# Patient Record
Sex: Male | Born: 1965 | Race: White
Health system: Southern US, Community
[De-identification: ages and names within clinical notes are randomized; demographics above are authoritative.]

## PROBLEM LIST (undated history)

## (undated) DIAGNOSIS — E119 Type 2 diabetes mellitus without complications: Secondary | ICD-10-CM

## (undated) DIAGNOSIS — E785 Hyperlipidemia, unspecified: Secondary | ICD-10-CM

## (undated) DIAGNOSIS — R011 Cardiac murmur, unspecified: Secondary | ICD-10-CM

## (undated) DIAGNOSIS — I10 Essential (primary) hypertension: Secondary | ICD-10-CM

## (undated) DIAGNOSIS — K743 Primary biliary cirrhosis: Secondary | ICD-10-CM

## (undated) DIAGNOSIS — E7800 Pure hypercholesterolemia, unspecified: Secondary | ICD-10-CM

## (undated) DIAGNOSIS — T7840XA Allergy, unspecified, initial encounter: Secondary | ICD-10-CM

## (undated) DIAGNOSIS — M1990 Unspecified osteoarthritis, unspecified site: Secondary | ICD-10-CM

## (undated) DIAGNOSIS — Z8679 Personal history of other diseases of the circulatory system: Secondary | ICD-10-CM

## (undated) DIAGNOSIS — N453 Epididymo-orchitis: Secondary | ICD-10-CM

## (undated) DIAGNOSIS — M546 Pain in thoracic spine: Secondary | ICD-10-CM

## (undated) DIAGNOSIS — J309 Allergic rhinitis, unspecified: Secondary | ICD-10-CM

## (undated) DIAGNOSIS — S139XXA Sprain of joints and ligaments of unspecified parts of neck, initial encounter: Secondary | ICD-10-CM

## (undated) DIAGNOSIS — L989 Disorder of the skin and subcutaneous tissue, unspecified: Secondary | ICD-10-CM

## (undated) DIAGNOSIS — K219 Gastro-esophageal reflux disease without esophagitis: Secondary | ICD-10-CM

## (undated) DIAGNOSIS — F528 Other sexual dysfunction not due to a substance or known physiological condition: Secondary | ICD-10-CM

## (undated) DIAGNOSIS — M109 Gout, unspecified: Secondary | ICD-10-CM

## (undated) HISTORY — DX: Primary biliary cirrhosis: K74.3

## (undated) HISTORY — DX: Type 2 diabetes mellitus without complications: E11.9

## (undated) HISTORY — DX: Pain in thoracic spine: M54.6

## (undated) HISTORY — DX: Personal history of other diseases of the circulatory system: Z86.79

## (undated) HISTORY — DX: Gastro-esophageal reflux disease without esophagitis: K21.9

## (undated) HISTORY — PX: OTHER SURGICAL HISTORY: SHX169

## (undated) HISTORY — DX: Gout, unspecified: M10.9

## (undated) HISTORY — DX: Unspecified osteoarthritis, unspecified site: M19.90

## (undated) HISTORY — DX: Essential (primary) hypertension: I10

## (undated) HISTORY — DX: Pure hypercholesterolemia, unspecified: E78.00

## (undated) HISTORY — DX: Allergic rhinitis, unspecified: J30.9

## (undated) HISTORY — PX: COLONOSCOPY: SHX174

## (undated) HISTORY — DX: Sprain of joints and ligaments of unspecified parts of neck, initial encounter: S13.9XXA

## (undated) HISTORY — DX: Disorder of the skin and subcutaneous tissue, unspecified: L98.9

## (undated) HISTORY — DX: Other sexual dysfunction not due to a substance or known physiological condition: F52.8

## (undated) HISTORY — DX: Hyperlipidemia, unspecified: E78.5

## (undated) HISTORY — DX: Epididymo-orchitis: N45.3

## (undated) HISTORY — DX: Allergy, unspecified, initial encounter: T78.40XA

## (undated) HISTORY — DX: Cardiac murmur, unspecified: R01.1

---

## 2003-12-10 HISTORY — PX: PERCUTANEOUS LIVER BIOPSY: SUR136

## 2004-02-17 ENCOUNTER — Ambulatory Visit (HOSPITAL_COMMUNITY): Admission: RE | Admit: 2004-02-17 | Discharge: 2004-02-17 | Payer: Self-pay | Admitting: Gastroenterology

## 2004-02-17 ENCOUNTER — Encounter (INDEPENDENT_AMBULATORY_CARE_PROVIDER_SITE_OTHER): Payer: Self-pay | Admitting: Specialist

## 2004-02-17 DIAGNOSIS — K743 Primary biliary cirrhosis: Secondary | ICD-10-CM

## 2004-02-17 HISTORY — DX: Primary biliary cirrhosis: K74.3

## 2004-02-20 ENCOUNTER — Ambulatory Visit (HOSPITAL_COMMUNITY): Admission: RE | Admit: 2004-02-20 | Discharge: 2004-02-20 | Payer: Self-pay | Admitting: Diagnostic Radiology

## 2005-01-04 ENCOUNTER — Ambulatory Visit: Payer: Self-pay | Admitting: Internal Medicine

## 2005-01-08 ENCOUNTER — Ambulatory Visit: Payer: Self-pay | Admitting: Internal Medicine

## 2005-02-06 ENCOUNTER — Ambulatory Visit: Payer: Self-pay | Admitting: Internal Medicine

## 2005-04-26 ENCOUNTER — Ambulatory Visit: Payer: Self-pay | Admitting: Internal Medicine

## 2006-01-14 ENCOUNTER — Ambulatory Visit: Payer: Self-pay | Admitting: Internal Medicine

## 2006-01-17 ENCOUNTER — Ambulatory Visit: Payer: Self-pay | Admitting: Internal Medicine

## 2006-03-25 ENCOUNTER — Ambulatory Visit: Payer: Self-pay | Admitting: Gastroenterology

## 2007-01-12 ENCOUNTER — Ambulatory Visit: Payer: Self-pay | Admitting: Internal Medicine

## 2007-01-12 ENCOUNTER — Ambulatory Visit: Payer: Self-pay | Admitting: Cardiology

## 2007-01-12 ENCOUNTER — Inpatient Hospital Stay (HOSPITAL_COMMUNITY): Admission: EM | Admit: 2007-01-12 | Discharge: 2007-01-13 | Payer: Self-pay | Admitting: Emergency Medicine

## 2007-01-12 LAB — CONVERTED CEMR LAB
ALT: 70 units/L — ABNORMAL HIGH (ref 0–40)
AST: 40 units/L — ABNORMAL HIGH (ref 0–37)
BUN: 11 mg/dL (ref 6–23)
Basophils Absolute: 0 10*3/uL (ref 0.0–0.1)
Basophils Relative: 0.2 % (ref 0.0–1.0)
Bilirubin Urine: NEGATIVE
CO2: 33 meq/L — ABNORMAL HIGH (ref 19–32)
Calcium: 9.9 mg/dL (ref 8.4–10.5)
Chloride: 100 meq/L (ref 96–112)
Creatinine, Ser: 1.1 mg/dL (ref 0.4–1.5)
GFR calc Af Amer: 95 mL/min
Glucose, Bld: 134 mg/dL — ABNORMAL HIGH (ref 70–99)
HCT: 43.1 % (ref 39.0–52.0)
HDL: 39.5 mg/dL (ref >39.0)
Hemoglobin, Urine: NEGATIVE
Hemoglobin: 15 g/dL (ref 13.0–17.0)
Hgb A1c MFr Bld: 5.8 % (ref 4.6–6.0)
Ketones, ur: NEGATIVE mg/dL
LDL Cholesterol: 68 mg/dL (ref 0–99)
MCV: 87.5 fL (ref 78.0–100.0)
Monocytes Relative: 12.9 % — ABNORMAL HIGH (ref 3.0–11.0)
Neutrophils Relative %: 72.8 % (ref 43.0–77.0)
Platelets: 207 10*3/uL (ref 150–400)
TSH: 0.4 microintl units/mL (ref 0.35–5.50)
Total Bilirubin: 0.8 mg/dL (ref 0.3–1.2)
Triglycerides: 77 mg/dL (ref 0–149)
VLDL: 15 mg/dL (ref 0–40)
WBC: 6.4 10*3/uL (ref 4.5–10.5)
pH: 6 (ref 5.0–8.0)

## 2007-02-10 ENCOUNTER — Ambulatory Visit: Payer: Self-pay | Admitting: Internal Medicine

## 2007-02-19 ENCOUNTER — Ambulatory Visit: Payer: Self-pay | Admitting: Cardiology

## 2007-08-06 ENCOUNTER — Encounter: Payer: Self-pay | Admitting: Internal Medicine

## 2007-08-06 DIAGNOSIS — E7800 Pure hypercholesterolemia, unspecified: Secondary | ICD-10-CM

## 2007-08-06 DIAGNOSIS — F528 Other sexual dysfunction not due to a substance or known physiological condition: Secondary | ICD-10-CM

## 2007-08-06 DIAGNOSIS — K745 Biliary cirrhosis, unspecified: Secondary | ICD-10-CM | POA: Insufficient documentation

## 2007-08-06 HISTORY — DX: Pure hypercholesterolemia, unspecified: E78.00

## 2007-08-06 HISTORY — DX: Other sexual dysfunction not due to a substance or known physiological condition: F52.8

## 2007-08-15 DIAGNOSIS — I10 Essential (primary) hypertension: Secondary | ICD-10-CM

## 2007-08-15 DIAGNOSIS — E785 Hyperlipidemia, unspecified: Secondary | ICD-10-CM | POA: Insufficient documentation

## 2007-08-15 HISTORY — DX: Hyperlipidemia, unspecified: E78.5

## 2007-08-15 HISTORY — DX: Essential (primary) hypertension: I10

## 2007-11-17 ENCOUNTER — Telehealth: Payer: Self-pay | Admitting: Internal Medicine

## 2008-01-19 ENCOUNTER — Ambulatory Visit: Payer: Self-pay | Admitting: Internal Medicine

## 2008-01-19 LAB — CONVERTED CEMR LAB
ALT: 72 units/L — ABNORMAL HIGH (ref 0–53)
AST: 70 units/L — ABNORMAL HIGH (ref 0–37)
Albumin: 4.3 g/dL (ref 3.5–5.2)
Basophils Relative: 0.3 % (ref 0.0–1.0)
Bilirubin Urine: NEGATIVE
Chloride: 101 meq/L (ref 96–112)
Eosinophils Absolute: 0.1 10*3/uL (ref 0.0–0.6)
Eosinophils Relative: 3.2 % (ref 0.0–5.0)
GFR calc Af Amer: 120 mL/min
HCT: 45 % (ref 39.0–52.0)
HDL: 39.4 mg/dL (ref >39.0)
Hemoglobin, Urine: NEGATIVE
Hemoglobin: 14.8 g/dL (ref 13.0–17.0)
Ketones, ur: NEGATIVE mg/dL
MCV: 89.1 fL (ref 78.0–100.0)
Neutro Abs: 2 10*3/uL (ref 1.4–7.7)
Neutrophils Relative %: 45.6 % (ref 43.0–77.0)
PSA: 0.54 ng/mL (ref 0.10–4.00)
Platelets: 194 10*3/uL (ref 150–400)
Potassium: 3.6 meq/L (ref 3.5–5.1)
Specific Gravity, Urine: 1.025 (ref 1.000–1.03)
Total CHOL/HDL Ratio: 3.6
Triglycerides: 98 mg/dL (ref 0–149)
Urine Glucose: NEGATIVE mg/dL
Urobilinogen, UA: 0.2 (ref 0.0–1.0)
WBC: 4.3 10*3/uL — ABNORMAL LOW (ref 4.5–10.5)

## 2008-01-22 ENCOUNTER — Ambulatory Visit: Payer: Self-pay | Admitting: Internal Medicine

## 2008-01-22 DIAGNOSIS — M109 Gout, unspecified: Secondary | ICD-10-CM

## 2008-01-22 DIAGNOSIS — K219 Gastro-esophageal reflux disease without esophagitis: Secondary | ICD-10-CM

## 2008-01-22 DIAGNOSIS — J309 Allergic rhinitis, unspecified: Secondary | ICD-10-CM | POA: Insufficient documentation

## 2008-01-22 HISTORY — DX: Gastro-esophageal reflux disease without esophagitis: K21.9

## 2008-01-22 HISTORY — DX: Allergic rhinitis, unspecified: J30.9

## 2008-01-22 HISTORY — DX: Gout, unspecified: M10.9

## 2009-01-02 ENCOUNTER — Telehealth: Payer: Self-pay | Admitting: Internal Medicine

## 2009-01-16 ENCOUNTER — Ambulatory Visit: Payer: Self-pay | Admitting: Internal Medicine

## 2009-01-16 LAB — CONVERTED CEMR LAB
ALT: 77 units/L — ABNORMAL HIGH (ref 0–53)
AST: 52 units/L — ABNORMAL HIGH (ref 0–37)
Albumin: 4.4 g/dL (ref 3.5–5.2)
Alkaline Phosphatase: 99 units/L (ref 39–117)
Basophils Absolute: 0 10*3/uL (ref 0.0–0.1)
Bilirubin, Direct: 0.3 mg/dL (ref 0.0–0.3)
CO2: 33 meq/L — ABNORMAL HIGH (ref 19–32)
Chloride: 98 meq/L (ref 96–112)
Eosinophils Absolute: 0.1 10*3/uL (ref 0.0–0.7)
Glucose, Bld: 96 mg/dL (ref 70–99)
HDL: 34.6 mg/dL — ABNORMAL LOW (ref >39.0)
Ketones, ur: NEGATIVE mg/dL
LDL Cholesterol: 70 mg/dL (ref 0–99)
Leukocytes, UA: NEGATIVE
MCHC: 34.2 g/dL (ref 30.0–36.0)
MCV: 89.5 fL (ref 78.0–100.0)
Monocytes Absolute: 0.7 10*3/uL (ref 0.1–1.0)
Neutro Abs: 2.4 10*3/uL (ref 1.4–7.7)
Nitrite: NEGATIVE
Specific Gravity, Urine: 1.02 (ref 1.000–1.03)
TSH: 0.73 microintl units/mL (ref 0.35–5.50)
Total CHOL/HDL Ratio: 3.7
Triglycerides: 118 mg/dL (ref 0–149)
Urine Glucose: NEGATIVE mg/dL
Urobilinogen, UA: 1 (ref 0.0–1.0)
VLDL: 24 mg/dL (ref 0–40)
WBC: 4.5 10*3/uL (ref 4.5–10.5)

## 2009-01-30 ENCOUNTER — Ambulatory Visit: Payer: Self-pay | Admitting: Internal Medicine

## 2009-02-03 ENCOUNTER — Ambulatory Visit: Payer: Self-pay | Admitting: Endocrinology

## 2009-02-03 DIAGNOSIS — N453 Epididymo-orchitis: Secondary | ICD-10-CM

## 2009-02-03 HISTORY — DX: Epididymo-orchitis: N45.3

## 2009-02-03 LAB — CONVERTED CEMR LAB
Glucose, Urine, Semiquant: NEGATIVE
Ketones, urine, test strip: NEGATIVE
Urobilinogen, UA: 0.2
WBC Urine, dipstick: NEGATIVE

## 2009-09-22 ENCOUNTER — Ambulatory Visit: Payer: Self-pay | Admitting: Internal Medicine

## 2009-09-22 DIAGNOSIS — L989 Disorder of the skin and subcutaneous tissue, unspecified: Secondary | ICD-10-CM

## 2009-09-22 DIAGNOSIS — S139XXA Sprain of joints and ligaments of unspecified parts of neck, initial encounter: Secondary | ICD-10-CM

## 2009-09-22 HISTORY — DX: Sprain of joints and ligaments of unspecified parts of neck, initial encounter: S13.9XXA

## 2009-09-22 HISTORY — DX: Disorder of the skin and subcutaneous tissue, unspecified: L98.9

## 2009-10-06 ENCOUNTER — Ambulatory Visit: Payer: Self-pay | Admitting: Internal Medicine

## 2010-02-13 ENCOUNTER — Ambulatory Visit: Payer: Self-pay | Admitting: Internal Medicine

## 2010-02-13 LAB — CONVERTED CEMR LAB
ALT: 57 units/L — ABNORMAL HIGH (ref 0–53)
Albumin: 4.1 g/dL (ref 3.5–5.2)
Alkaline Phosphatase: 93 units/L (ref 39–117)
Basophils Relative: 0.3 % (ref 0.0–3.0)
Bilirubin Urine: NEGATIVE
CO2: 32 meq/L (ref 19–32)
Calcium: 9.5 mg/dL (ref 8.4–10.5)
Chloride: 101 meq/L (ref 96–112)
Creatinine, Ser: 0.8 mg/dL (ref 0.4–1.5)
Eosinophils Relative: 3 % (ref 0.0–5.0)
HCT: 46.2 % (ref 39.0–52.0)
Hemoglobin, Urine: NEGATIVE
Hemoglobin: 15.4 g/dL (ref 13.0–17.0)
LDL Cholesterol: 75 mg/dL (ref 0–99)
Lymphocytes Relative: 32.3 % (ref 12.0–46.0)
MCHC: 33.3 g/dL (ref 30.0–36.0)
MCV: 92.1 fL (ref 78.0–100.0)
Monocytes Absolute: 0.7 10*3/uL (ref 0.1–1.0)
Nitrite: NEGATIVE
Platelets: 171 10*3/uL (ref 150.0–400.0)
Potassium: 3.7 meq/L (ref 3.5–5.1)
RDW: 12.4 % (ref 11.5–14.6)
Sodium: 140 meq/L (ref 135–145)
Specific Gravity, Urine: 1.01 (ref 1.000–1.030)
TSH: 1.1 microintl units/mL (ref 0.35–5.50)
Total Protein: 8.4 g/dL — ABNORMAL HIGH (ref 6.0–8.3)
Urobilinogen, UA: 0.2 (ref 0.0–1.0)

## 2010-02-14 ENCOUNTER — Ambulatory Visit: Payer: Self-pay | Admitting: Internal Medicine

## 2010-02-14 LAB — CONVERTED CEMR LAB: Hep A IgM: NEGATIVE

## 2010-06-15 ENCOUNTER — Encounter: Payer: Self-pay | Admitting: Internal Medicine

## 2011-01-10 NOTE — Letter (Signed)
Summary: [facility]   Imported By: Sherian Rein 06/20/2010 14:15:00  _____________________________________________________________________  External Attachment:    Type:   Image     Comment:   External Document

## 2011-01-10 NOTE — Assessment & Plan Note (Signed)
Summary: CPX/ NWS  #   Vital Signs:  Patient profile:   45 year old male Height:      68 inches Weight:      231 pounds BMI:     35.25 O2 Sat:      97 % on Room air Temp:     98.1 degrees F oral Pulse rate:   72 / minute BP sitting:   124 / 68  (left arm) Cuff size:   large  Vitals Entered ByZella Ball Ewing (February 14, 2010 8:36 AM)  O2 Flow:  Room air  Preventive Care Screening     had flu shot last fall  CC: Adult Physical/RE   Primary Care Provider:  Corwin Levins MD  CC:  Adult Physical/RE.  History of Present Illness: overall doing well;  had Hep B series with the [facility]  recently;  wt stable, no OSA symptoms, Pt denies CP, sob, doe, wheezing, orthopnea, pnd, worsening LE edema, palps, dizziness or syncope Pt denies new neuro symptoms such as headache, facial or extremity weakness   Pt denies polydipsia, polyuria, Overall good compliance with meds, trying to follow low chol, diet, wt stable, little excercise however . Tolerating meds well.  Need refills.    Preventive Screening-Counseling & Management      Drug Use:  no.    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 Take 1 By Mouth Once Daily 2)  Taztia Xt 300 Mg  Cp24 (Diltiazem Hcl Er Beads) .... Take 1 By Mouth Once Daily 3)  Benicar  Hct 40-12.5 Mg  Tabs (Olmesartan Medoxomil-Hctz) .... Take 1 Po Once Daily 4)  Ecotrin Low Strength 81 Mg  Tbec (Aspirin) .Marland Kitchen.. 1 By Mouth Qd 5)  Urso Forte 500 Mg Tabs (Ursodiol) .... 2 Tabs By Mouth in The Morning and 1 Tab At Bedtime 6)  Nasonex 50 Mcg/act Susp (Mometasone Furoate) .... 2 Spray/side Once Daily  Current Medications (verified): 1)  Lovastatin 40 Mg  Tabs (Lovastatin) .... Take 1 By Mouth Once Daily 2)  Taztia Xt 300 Mg  Cp24 (Diltiazem Hcl Er Beads) .... Take 1 By Mouth Once Daily 3)  Benicar Hct 40-12.5 Mg  Tabs (Olmesartan Medoxomil-Hctz) .... Take 1 Po Once Daily 4)  Ecotrin Low Strength 81 Mg  Tbec (Aspirin) .Marland Kitchen.. 1 By Mouth Qd 5)  Urso Forte 500 Mg Tabs (Ursodiol) .... 2 Tabs By Mouth in The Morning and 1 Tab At Bedtime 6)  Nasonex 50 Mcg/act Susp (Mometasone Furoate) .... 2 Spray/side Once Daily 7)  Fexofenadine Hcl 180 Mg Tabs (Fexofenadine Hcl) .Marland Kitchen.. 1 By Mouth Once Daily As Needed  Allergies (verified): 1)  ! Codeine 2)  ! * Oxycodone 3)  ! Norvasc 4)  ! Pcn  Past History:  Past Medical History: Last updated: 01/22/2008 Granulomatous hepatitis Erectile Dysfunction  Hypertension Hyperlipidemia Gout Primary Biliary Cirrhosis hx CTS bilat E.D. Allergic rhinitis GERD normal coronary arteries 2/08  Past Surgical History: Last updated: 01/22/2008 s/p septal nasal repair s/p sinus polyp removed  Family History: Last updated: 01/30/2009 mother with DM, HTN, elev cholesterol father with stroke  Social History: Last updated: 02/14/2010 Married Never Smoked Alcohol use-yes 3 children work - Radiation protection practitioner Drug use-no  Risk Factors: Alcohol Use: 0 (09/22/2009)  Risk Factors: Smoking Status: never (09/22/2009)  Social History: Reviewed history from 01/22/2008 and no changes required. Married Never Smoked Alcohol use-yes 3 children work - Radiation protection practitioner Drug use-no Drug Use:  no  Review of Systems  The patient  denies anorexia, fever, weight loss, weight gain, vision loss, decreased hearing, hoarseness, chest pain, syncope, dyspnea on exertion, peripheral edema, prolonged cough, headaches, hemoptysis, abdominal pain, melena, hematochezia, severe indigestion/heartburn, hematuria, muscle weakness, suspicious skin lesions, transient blindness, depression, unusual weight change, abnormal bleeding, enlarged lymph nodes, and angioedema.         all otherwise negative per pt -    Physical Exam  General:  alert and overweight-appearing.   Head:  normocephalic and atraumatic.   Eyes:  vision grossly intact, pupils equal, and pupils round.   Ears:  R ear normal and L ear normal.   Nose:  no external deformity and no nasal discharge.   Mouth:  no gingival abnormalities and pharynx pink and moist.   Neck:  supple and no masses.   Lungs:  normal respiratory effort and normal breath sounds.   Heart:  normal rate and regular rhythm.   Abdomen:  soft, non-tender, and normal bowel sounds.   Msk:  no joint tenderness and no joint swelling.   Extremities:  no edema, no erythema  Neurologic:  cranial nerves II-XII intact and strength normal in all extremities.     Impression & Recommendations:  Problem # 1:  Preventive Health Care (ICD-V70.0) Overall doing well, age appropriate education and counseling updated and referral for appropriate preventive services done unless declined, immunizations up to date or declined, diet counseling done if overweight, urged to quit smoking if smokes , most recent labs reviewed and current ordered if appropriate, ecg reviewed or declined (interpretation per ECG scanned in the EMR if done); information regarding Medicare Prevention requirements given if appropriate   Complete Medication List: 1)  Lovastatin 40 Mg Tabs (Lovastatin) .... Take 1 by mouth once daily 2)  Taztia Xt 300 Mg Cp24 (Diltiazem hcl er beads) .... Take 1 by mouth once daily 3)  Benicar Hct 40-12.5 Mg Tabs  (Olmesartan medoxomil-hctz) .... Take 1 po once daily 4)  Ecotrin Low Strength 81 Mg Tbec (Aspirin) .Marland Kitchen.. 1 by mouth qd 5)  Urso Forte 500 Mg Tabs (Ursodiol) .... 2 tabs by mouth in the morning and 1 tab at bedtime 6)  Nasonex 50 Mcg/act Susp (Mometasone furoate) .... 2 spray/side once daily 7)  Fexofenadine Hcl 180 Mg Tabs (Fexofenadine hcl) .Marland Kitchen.. 1 by mouth once daily as needed  Patient Instructions: 1)  Continue all previous medications as before this visit  2)  Please schedule a follow-up appointment in 1 year or sooner if needed Prescriptions: FEXOFENADINE HCL 180 MG TABS (FEXOFENADINE HCL) 1 by mouth once daily as needed  #90 x 3   Entered and Authorized by:   Corwin Levins MD   Signed by:   Corwin Levins MD on 02/14/2010   Method used:   [facility]ron[facility] to  [facility]  [place] [address]* (retail)       [address]       [place], [place]  [address]       Ph: [phone] or [phone]       Fax: [phone]   RxID:   [id number] NASONEX 50 MCG/ACT SUSP (MOMETASONE FUROATE) 2 spray/side once daily  #3inh x 3   Entered and Authorized by:   Corwin Levins MD   Signed by:   Corwin Levins MD on 02/14/2010   Method used:   [facility]        [facility]  [place] [address] (retail)       [address]       [place], [place]  [address]       Ph: [phone] or [phone]       Fax: [phone]   RxID:   [id number] BENICAR HCT 40-12.5 MG  TABS (OLMESARTAN MEDOXOMIL-HCTZ) take 1 po once daily  #90 x 3   Entered and Authorized by:   Corwin Levins MD   Signed by:   Corwin Levins MD on 02/14/2010   Method used:   [facility]        [facility]  [place] [address] (retail)       [address]       [place], [place]  [address]       Ph: [phone] or [phone]       Fax: [phone]   RxID:   [id number] TAZTIA XT 300 MG  CP24 (DILTIAZEM HCL ER BEADS) take 1 by mouth once daily  #90 x 3   Entered and Authorized by:   Corwin Levins MD   Signed by:   Corwin Levins MD on 02/14/2010    Method used:   [facility]        [facility]  [place] [address] (retail)       [address]       [place], [place]  [address]       Ph: [phone] or [phone]       Fax: [phone]   RxID:   [id number] LOVASTATIN 40 MG  TABS (LOVASTATIN) take 1 by mouth once daily  #90 x 3   Entered and Authorized by:   Corwin Levins MD   Signed by:   Corwin Levins MD on 02/14/2010   Method used:   [facility]        [facility]  [place] [address] (retail)       [address]       [place], [place]  [address]       Ph: [phone] or [phone]       Fax: [phone]   RxID:   [id number]

## 2011-03-28 ENCOUNTER — Other Ambulatory Visit: Payer: Self-pay | Admitting: Internal Medicine

## 2011-03-28 ENCOUNTER — Other Ambulatory Visit (INDEPENDENT_AMBULATORY_CARE_PROVIDER_SITE_OTHER): Payer: BC Managed Care – PPO

## 2011-03-28 ENCOUNTER — Other Ambulatory Visit (INDEPENDENT_AMBULATORY_CARE_PROVIDER_SITE_OTHER): Payer: BC Managed Care – PPO | Admitting: Internal Medicine

## 2011-03-28 DIAGNOSIS — Z1289 Encounter for screening for malignant neoplasm of other sites: Secondary | ICD-10-CM

## 2011-03-28 DIAGNOSIS — Z0000 Encounter for general adult medical examination without abnormal findings: Secondary | ICD-10-CM

## 2011-03-28 DIAGNOSIS — E785 Hyperlipidemia, unspecified: Secondary | ICD-10-CM

## 2011-03-28 LAB — CBC WITH DIFFERENTIAL/PLATELET
Basophils Absolute: 0 10*3/uL (ref 0.0–0.1)
Eosinophils Absolute: 0.1 10*3/uL (ref 0.0–0.7)
Lymphs Abs: 1.8 10*3/uL (ref 0.7–4.0)
MCV: 89.8 fl (ref 78.0–100.0)
Neutro Abs: 2.3 10*3/uL (ref 1.4–7.7)
RBC: 4.63 Mil/uL (ref 4.22–5.81)
WBC: 5.1 10*3/uL (ref 4.5–10.5)

## 2011-03-28 LAB — BASIC METABOLIC PANEL
CO2: 31 mEq/L (ref 19–32)
Chloride: 103 mEq/L (ref 96–112)
Creatinine, Ser: 0.7 mg/dL (ref 0.4–1.5)
Glucose, Bld: 87 mg/dL (ref 70–99)
Potassium: 4.2 mEq/L (ref 3.5–5.1)

## 2011-03-28 LAB — HEPATIC FUNCTION PANEL
Bilirubin, Direct: 0.2 mg/dL (ref 0.0–0.3)
Total Bilirubin: 0.8 mg/dL (ref 0.3–1.2)
Total Protein: 7.5 g/dL (ref 6.0–8.3)

## 2011-03-28 LAB — LDL CHOLESTEROL, DIRECT: Direct LDL: 81.9 mg/dL

## 2011-03-28 LAB — LIPID PANEL
HDL: 36.2 mg/dL — ABNORMAL LOW (ref >39.00)
Triglycerides: 201 mg/dL — ABNORMAL HIGH (ref 0.0–149.0)
VLDL: 40.2 mg/dL — ABNORMAL HIGH (ref 0.0–40.0)

## 2011-03-28 LAB — URINALYSIS
Hgb urine dipstick: NEGATIVE
Ketones, ur: NEGATIVE
Leukocytes, UA: NEGATIVE
Urine Glucose: NEGATIVE
Urobilinogen, UA: 0.2 (ref 0.0–1.0)

## 2011-03-31 ENCOUNTER — Encounter: Payer: Self-pay | Admitting: Internal Medicine

## 2011-03-31 DIAGNOSIS — Z0000 Encounter for general adult medical examination without abnormal findings: Secondary | ICD-10-CM | POA: Insufficient documentation

## 2011-04-05 ENCOUNTER — Encounter: Payer: Self-pay | Admitting: Internal Medicine

## 2011-04-05 ENCOUNTER — Ambulatory Visit (INDEPENDENT_AMBULATORY_CARE_PROVIDER_SITE_OTHER): Payer: BC Managed Care – PPO | Admitting: Internal Medicine

## 2011-04-05 VITALS — BP 144/78 | HR 79 | Temp 98.0°F | Ht 69.0 in | Wt 232.2 lb

## 2011-04-05 DIAGNOSIS — M109 Gout, unspecified: Secondary | ICD-10-CM

## 2011-04-05 DIAGNOSIS — Z8679 Personal history of other diseases of the circulatory system: Secondary | ICD-10-CM | POA: Insufficient documentation

## 2011-04-05 DIAGNOSIS — Z0000 Encounter for general adult medical examination without abnormal findings: Secondary | ICD-10-CM

## 2011-04-05 HISTORY — DX: Personal history of other diseases of the circulatory system: Z86.79

## 2011-04-05 MED ORDER — OLMESARTAN MEDOXOMIL-HCTZ 40-12.5 MG PO TABS: 1.0000 | ORAL_TABLET | Freq: Every day | ORAL | Status: DC

## 2011-04-05 MED ORDER — MOMETASONE FUROATE 50 MCG/ACT NA SUSP: 2.0000 | Freq: Every day | NASAL | Status: DC

## 2011-04-05 MED ORDER — LOVASTATIN 40 MG PO TABS: 40.0000 mg | ORAL_TABLET | Freq: Every day | ORAL | Status: DC

## 2011-04-05 MED ORDER — DILTIAZEM HCL ER BEADS 300 MG PO CP24: 300.0000 mg | ORAL_CAPSULE | Freq: Every day | ORAL | Status: DC

## 2011-04-05 NOTE — Progress Notes (Signed)
Subjective:    Patient ID: [name], male    DOB: Jul 06, 1966, 45 y.o.   MRN: [id number]  HPI  Here for wellness and f/u;  Overall doing ok;  Pt denies CP, worsening SOB, DOE, wheezing, orthopnea, PND, worsening LE edema, palpitations, dizziness or syncope.  Pt denies neurological change such as new Headache, facial or extremity weakness.  Pt denies polydipsia, polyuria, or low sugar symptoms. Pt states overall good compliance with treatment and medications, good tolerability, and trying to follow lower cholesterol diet.  Pt denies worsening depressive symptoms, suicidal ideation or panic. No fever, wt loss, night sweats, loss of appetite, or other constitutional symptoms.  Pt states good ability with ADL's, low fall risk, home safety reviewed and adequate, no significant changes in hearing or vision, and occasionally active with exercise. No other ongoing complaints.  Does see [facility] every 6 mo related to PBC.  BP at home has been < 140/90.  Past Medical History  Diagnosis Date  . HYPERCHOLESTEROLEMIA 08/06/2007  . HYPERLIPIDEMIA 08/15/2007  . GOUT 01/22/2008  . ERECTILE DYSFUNCTION 08/06/2007  . HYPERTENSION 08/15/2007  . ALLERGIC RHINITIS 01/22/2008  . GERD 01/22/2008  . Biliary cirrhosis 08/06/2007  . EPIDIDYMITIS 02/03/2009  . SKIN LESION 09/22/2009  . NECK SPRAIN AND STRAIN 09/22/2009  . 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 S/p septal nasal repair   . S/p sinus polyp removed     reports that he has never smoked. He does not have any smokeless tobacco history on file. He reports that he drinks alcohol. He reports that he does not use illicit drugs. family history includes Diabetes in his mother; Hyperlipidemia in his mother; Hypertension in his mother; and Stroke in his father. Allergies  Allergen Reactions  . Amlodipine Besylate   . Codeine   . Penicillins    Current Outpatient Prescriptions on File  Prior to Visit  Medication Sig Dispense Refill  . aspirin 81 MG EC tablet Take 81 mg by mouth daily.        . ursodiol (URSO FORTE) 500 MG tablet 2 tablets by mouth in the morning and 1 tablet at bedtime       . fexofenadine (ALLEGRA) 180 MG tablet Take 180 mg by mouth daily as needed.         Review of Systems Review of Systems  Constitutional: Negative for diaphoresis, activity change, appetite change and unexpected weight change.  HENT: Negative for hearing loss, ear pain, facial swelling, mouth sores and neck stiffness.   Eyes: Negative for pain, redness and visual disturbance.  Respiratory: Negative for shortness of breath and wheezing.   Cardiovascular: Negative for chest pain and palpitations.  Gastrointestinal: Negative for diarrhea, blood in stool, abdominal distention and rectal pain.  Genitourinary: Negative for hematuria, flank pain and decreased urine volume.  Musculoskeletal: Negative for myalgias and joint swelling.  Skin: Negative for color change and wound.  Neurological: Negative for syncope and numbness.  Hematological: Negative for adenopathy.  Psychiatric/Behavioral: Negative for hallucinations, self-injury, decreased concentration and agitation.      Objective:   Physical Exam BP 144/78  Pulse 79  Temp(Src) 98 F (36.7 C) (Oral)  Ht 5\' 9"  (1.753 m)  Wt 232 lb 4 oz (105.348 kg)  BMI 34.30 kg/m2  SpO2 97% Physical Exam  VS noted, obese Constitutional: Pt is oriented to person, place, and time. Appears well-developed and well-nourished.  HENT:  Head: Normocephalic  and atraumatic.  Right Ear: External ear normal.  Left Ear: External ear normal.  Nose: Nose normal.  Mouth/Throat: Oropharynx is clear and moist.  Eyes: Conjunctivae and EOM are normal. Pupils are equal, round, and reactive to light.  Neck: Normal range of motion. Neck supple. No JVD present. No tracheal deviation present.  Cardiovascular: Normal rate, regular rhythm, normal heart sounds and  intact distal pulses.   Pulmonary/Chest: Effort normal and breath sounds normal.  Abdominal: Soft. Bowel sounds are normal. There is no tenderness.  Musculoskeletal: Normal range of motion. Exhibits no edema.  Lymphadenopathy:  Has no cervical adenopathy.  Neurological: Pt is alert and oriented to person, place, and time. Pt has normal reflexes. No cranial nerve deficit.  Skin: Skin is warm and dry. No rash noted.  Psychiatric:  Has  normal mood and affect. Behavior is normal.         Assessment & Plan:

## 2011-04-05 NOTE — Patient Instructions (Signed)
Continue all other medications as before Please return in 1 year for your yearly visit, or sooner if needed, with Lab testing done 3-5 days before

## 2011-04-06 ENCOUNTER — Encounter: Payer: Self-pay | Admitting: Internal Medicine

## 2011-04-06 NOTE — Assessment & Plan Note (Signed)

## 2011-04-10 ENCOUNTER — Other Ambulatory Visit: Payer: Self-pay | Admitting: Internal Medicine

## 2011-04-16 ENCOUNTER — Other Ambulatory Visit: Payer: Self-pay | Admitting: Internal Medicine

## 2011-04-26 NOTE — Consult Note (Signed)
[name], [name]              ACCOUNT NO.:  [id number]   MEDICAL RECORD NO.:  [id number]          PATIENT TYPE:  INP   LOCATION:  6526                         FACILITY:  [facility]   PHYSICIAN:  Everardo Beals. Juanda Chance, MD, FACCDATE OF BIRTH:  05-29-1966   DATE OF CONSULTATION:  DATE OF DISCHARGE:                                 CONSULTATION   CARDIAC CONSULTATION   PRIMARY CARE PHYSICIAN:  Dr. Lona Kettle.   REASON FOR REFERRAL:  Evaluation of chest pain.   CLINICAL HISTORY:  [name] is 45 years old and has no prior history  of known heart disease.  Over the last month, he has been having chest  pain, which he describes is a substernal pressure.  Dr. Alwyn Ren saw him  and scheduled him for evaluation with a rest stress test exercise  Myoview scan.  However, this weekend on Saturday, an episode of severe  substernal pressure lasting about 30 minutes and relieved after 2  nitroglycerin.  His wife, who was at work at the time, encouraged him to  go to the hospital that evening when she arrived home, but he decided  against this.  He came in today for his stress test and his ECG showed  new T-wave inversions in the inferior and lateral leads.  The techs  brought this to my attention and we decided not to do the stress test  and decided to see him in consultation.   He said, that over the last month, he has had exertional shortness of  breath and he does have some tightness with this.  He has had no  palpitations.   PAST MEDICAL HISTORY:  Significant for:  1. Hypertension.  2. Hyperlipidemia.  3. Smoking.  4. He also has had surgery for a hiatal hernia.  5. Has gastroesophageal reflux disease.   CURRENT MEDICATIONS:  Include:  1. Pravastatin 40 mg daily.  2. Lisinopril 20 mg 1/2 tablet daily.  3. Nexium 40 mg daily.  4. Chantix 1 mg b.i.d.  5. Baby aspirin 81 mg a day.   SOCIAL HISTORY:  [name] is recently married.  His wife is formerly  a Engineer, civil (consulting) a [employer], is  currently a nurse at [employer].  He  smokes, but has cut way back from his levels of a pack and a half a day.  He and his current wife have no children, but they each have 1 children  by a prior marriage.   PAST FAMILY HISTORY:  Positive in that his father had a myocardial  infarction and bypass surgery and diabetes.  He is not sure if his  mother had coronary heart disease.   REVIEW OF SYSTEMS:  Positive for symptoms of fatigue.   EXAMINATION:  VITAL SIGNS:  His blood pressure is 141/93 and a pulse of  75 and regular.  NECK:  There was no venous distention.  The carotid pulses were full  without bruits.  CHEST:  Clear without rales or rhonchi.  CARDIAC:  Rhythm was regular.  The heart sounds were normal.  There are  no murmurs or gallops.  ABDOMEN:  Soft  with normal bowel sounds.  There is no  hepatosplenomegaly.  EXTREMITIES:  Peripheral pulses were full.  There was no peripheral  edema.  MUSCULOSKELETAL:  Showed no deformities.  SKIN:  Warm and dry.  NEUROLOGICAL EXAMINATION:  Showed no focal neurological signs.   IMPRESSION:  1. Recent chest pain and abnormal ECGs, suspect angina due to ischemic      heart disease.  2. Hypertension.  3. Hyperlipidemia.  4. Continued cigarettes smoking.  5. Positive family history for coronary heart disease.   RECOMMENDATIONS:  In view of the abnormal prolonged pain this weekend  and abnormal ECG, I do not think we should proceed with stress testing  at this time.  I think the patient should, instead, be evaluated with  coronary angiography.  I discussed the procedure and risks with the  patient and his wife and they are agreeable to proceeding.  We might  consider admitting him to the hospital today for a catheterization  tomorrow, except there are no hospital beds and he would have to stay in  the emergency room all night.  I think the stress of this experience  probably out weighs waiting for him to come in for an a.m. admit  for  cath tomorrow.  He and his wife know if he has any recurrent symptoms,  he should go direct to the emergency department.  He also has  nitroglycerin available and we will put him on a full uncoated aspirin  and add Toprol 25 mg a day.      Bruce Elvera Lennox Juanda Chance, MD, [facility]  Electronically Signed     BRB/MEDQ  D:  01/12/2007  T:  01/13/2007  Job:  [id number]

## 2011-04-26 NOTE — Assessment & Plan Note (Signed)
[facility]                            CARDIOLOGY OFFICE NOTE   [name], [name]                     MRN:          [id number]  DATE:02/19/2007                            DOB:          1966/10/11    CLINICAL HISTORY:  [name] is 45 years old and was recently  hospitalized with an episode of chest pain. His pain was somewhat  pleuritic in nature and we thought it might pericarditis, but we were  concerned about ischemia. I saw him in the office and he was transferred  to the hospital and Dr. Diona Browner did a cardiac catheterization which was  normal. He subsequently had a echocardiogram which showed a small  posterior effusion. He had a CT of the chest which showed no definite  pulmonary emboli. He was discharged on Naprosyn and his symptoms  resolved over the next several days. He has done quite well since that  time and has not had any recurrent symptoms.   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   PAST MEDICAL HISTORY:  Significant for hypertension and hyperlipidemia.   CURRENT MEDICATIONS:  1. Lovastatin.  2. Cardizem.  3. Iron.  4. Benicar/hydrochlorothiazide.   REVIEW OF SYSTEMS:  Indicated that he had __________ gout with starting  the diuretic and this resolved with decreasing the dose of the diuretic.   PHYSICAL EXAMINATION:  VITAL SIGNS: Blood pressure 129/80, pulse 84 and  regular.  NECK: There was no vein distension. Carotid pulses were full without  bruits.  CHEST: Clear.  HEART: Rhythm was regular. There were no murmurs or gallops.  ABDOMEN: Soft. No organomegaly.  EXTREMITIES: Peripheral pulses were full and there was no peripheral  edema.   We did an electrocardiogram today which was normal. The  electrocardiogram that he had in the office showed rather diffuse ST  elevation.   IMPRESSION:  1. Acute etiopathic pericarditis now resolved.  2. Hypertension.  3. Hyperlipidemia.   RECOMMENDATIONS:  [name]  appears to have completely recovered from  his pericarditis. We will not plan any further evaluation and I do not  think he needs any further treatment. We will see him back on a p.r.n.  basis.     Bruce Elvera Lennox Juanda Chance, MD, [facility]  Electronically Signed    BRB/MedQ  DD: 02/19/2007  DT: 02/21/2007  Job #: [id number]

## 2011-04-26 NOTE — Cardiovascular Report (Signed)
[name], [name]              ACCOUNT NO.:  [id number]   MEDICAL RECORD NO.:  [id number]          PATIENT TYPE:  INP   LOCATION:  2807                         FACILITY:  [facility]   PHYSICIAN:  Jonelle Sidle, MD DATE OF BIRTH:  04/18/1966   DATE OF PROCEDURE:  01/12/2007  DATE OF DISCHARGE:                            CARDIAC CATHETERIZATION   REQUESTING CARDIOLOGIST:  Dr. Charlies Constable.   INDICATIONS:  [name] is a 45 year old male with a reported history  of hypertension, hyperlipidemia and primary biliary cirrhosis.  He was  evaluated by Dr. Juanda Chance today in the office with symptoms of chest pain  that were fairly pleuritic in description and associated with an  abnormal left cardiogram showing ST-segment elevation, predominately in  the inferolateral leads.  He also apparently had some mild hypoxia on  initial evaluation.  He was assessed and referred for hospital admission  with plans to proceed with a 2-D echocardiogram as well as a diagnostic  cardiac catheterization today to assess the pericardium, given the  possibility of pericarditis, as well as the coronary anatomy, given some  focality to the ST-segment changes.  Of note, his initial troponin I  level was 0.03.  He also reported a SHELLFISH allergy and was treated  with Solu-Medrol, Benadryl and Pepcid prior to proceeding.  The  potential risks and benefits of the procedure were explained to the  patient in advance and informed consent was obtained.   PROCEDURES PERFORMED:  1. Left heart catheterization.  2. Selective coronary angiography.  3. Left ventriculography.   ACCESS AND EQUIPMENT:  The area about the right femoral artery was  anesthetized with 1% lidocaine and a 6-French sheath was placed in the  right femoral artery via the modified Seldinger technique.  Standard  preformed 6-French JL-4 and JR-4 catheters used for selective coronary  geography and an angled pigtail catheter was used for left  heart  catheterization and left ventriculography.  A total of 85 mL of  Omnipaque were used.  All exchanges were made over wire, and the patient  tolerated the procedure well without immediate complications.   HEMODYNAMICS:  Aorta:  111/71 mmHg.  Left ventricle 117/15 mmHg.   ANGIOGRAPHIC FINDINGS:  1. The left main coronary artery is free of significant flow-limiting      coronary atherosclerosis and gives rise to the left anterior      descending and circumflex coronary arteries.  2. The left anterior descending is a large-caliber vessel with two      diagonal branches.  No significant flow-limiting coronary      atherosclerosis noted.  3. The circumflex coronary artery is a large-caliber vessel with very      large obtuse marginal supplying much of the lateral wall.  No      significant flow-limiting coronary atherosclerosis is noted.  4. The right coronary artery is a medium to large-caliber vessel with      posterolateral and posterior descending branches.  No significant      flow-limiting coronary atherosclerosis noted.   Left ventriculography is performed in the RAO projection and reveals an  ejection  fraction of approximately 65% with no focal anterior or  inferior wall motion abnormality and no significant mitral  regurgitation.   DIAGNOSES:  1. Angiographically normal coronary arteries.  2. Left ventricular ejection fraction of approximately 65% with no      focal wall motion abnormality or mitral regurgitation.  The left      ventricular end-diastolic pressure was 12-15 mmHg.  A minor aortic      valve pullback gradient was noted.   DISCUSSION:  I reviewed results with the patient, his wife, and with Dr.  Juanda Chance by phone.  Preliminarily, echocardiographic evaluation in the  catheterization lab showed normal left ventricular function with a trace  to small posterior pericardial effusion raising the possibility of  pericarditis.  The patient's cardiac markers are normal  at this point.  He is being admitted to the hospital for further observation.  Given his  mild hypoxia noted on initial presentation, a CT scan of the chest will  also be obtained in the morning to exclude thromboembolic disease.  In  the meanwhile, heparin will not be initiated given the concern about  pericarditis and the presence of a documented effusion.  Further plans  to follow as per Dr. Juanda Chance.      Jonelle Sidle, MD  Electronically Signed     SGM/MEDQ  D:  01/12/2007  T:  01/13/2007  Job:  [id number]   cc:   Everardo Beals. Juanda Chance, MD, [facility]

## 2011-04-26 NOTE — Discharge Summary (Signed)
NAMEGOPAL, MALTER              ACCOUNT NO.:  [id number]   MEDICAL RECORD NO.:  [id number]          PATIENT TYPE:  INP   LOCATION:  6526                         FACILITY:  [facility]   PHYSICIAN:  Veverly Fells. Excell Seltzer, MD  DATE OF BIRTH:  1966-02-09   DATE OF ADMISSION:  01/12/2007  DATE OF DISCHARGE:  01/13/2007                               DISCHARGE SUMMARY   PROCEDURES:  1. Cardiac catheterization.  2. Coronary arteriogram.  3. Left ventriculogram.  4. CT angiogram of the chest.  5. 2D echocardiogram.   PRINCIPAL DIAGNOSIS:  Chest pain, probable pericarditis.   SECONDARY DIAGNOSES:  1. Hyperglycemia felt secondary to steroids, received cardiac      catheterization.  2. Hypertension.  3. Hyperlipidemia.  4. History of abnormal chest x-ray with lower lobe atelectasis.  5. History of primary biliary cirrhosis.   TIME OF DISCHARGE:  Thirty-three minutes.   HOSPITAL COURSE:  Mr. [name] is a 45 year old male with a previous  history of coronary artery disease.  He thought he had an upper  respiratory infection but developed chest pain so went to see his  primary care physician.  His EKG was abnormal and he was seen by  cardiology.  Dr. Juanda Chance agreed that his EKG was abnormal and although  his chest pain was more typical for pericarditis, ischemia needed to be  ruled out.  He was admitted to the hospital for further evaluation.   Cardiac catheterization was performed which showed normal coronaries and  an EF of 65% with no MR.  Echocardiogram showed an EF of 65% with left  ventricular wall thickness at the upper limit of normal and a minimal  posterior pericardial effusion without hemodynamic effect.  His valve  had no significant abnormality.  The pericardium is not described as  thickened.   Because of an abnormal chest x-ray, CT of the chest was performed.  The  CT was limited exam with no definite central or saddle embolus although  peripheral PE not excluded.  The CT was  low volume with bibasilar and  right middle lobe atelectasis/consolidation.  He also had lower  periesophageal and upper abdominal mild adenopathy.   Mr. Hoffmeister had hyperglycemia with a fasting CBG of 213.  However this  is in the setting of having received steroids and this can be followed  by Dr. Jonny Ruiz.  Additionally, Mr. Renne was continued on his prior  dosage of Zocor and is to follow with Dr. Jonny Ruiz for a lipid profile which  he says is about due.   On January 13, 2007, Mr. Brummet was asymptomatic.  He is pending  evaluation by Dr. Excell Seltzer and final determination of his medications but  he is tentatively considered stable for discharge with outpatient  followup arranged.   DISCHARGE INSTRUCTIONS:  Activity level to be increased gradually.  He  is to call our office with any problems with the catheterization site.  He is to follow up with Dr. Juanda Chance on February 02, 2007 at 9:15 and  with Dr. Jonny Ruiz as needed.  He is to stick to a low sodium diet.  Discharge  medications will be dictated after Dr. Excell Seltzer evaluates him.      Theodore Demark, PA-C      Veverly Fells. Excell Seltzer, MD  Electronically Signed    RB/MEDQ  D:  01/13/2007  T:  01/14/2007  Job:  [id number]   cc:   Dr. Jonny Ruiz

## 2011-04-26 NOTE — Discharge Summary (Signed)
NAMEJEJUAN, SCALA              ACCOUNT NO.:  [id number]   MEDICAL RECORD NO.:  [id number]          PATIENT TYPE:  INP   LOCATION:  6526                         FACILITY:  [facility]   PHYSICIAN:  Everardo Beals. Juanda Chance, MD, FACCDATE OF BIRTH:  1966/03/01   DATE OF ADMISSION:  01/12/2007  DATE OF DISCHARGE:  01/13/2007                               DISCHARGE SUMMARY   ADDENDUM:  Dr. Juanda Chance evaluated the patient and reviewed the data.  He feels that  Mr. Zentz does not need to go home on antibiotics at this time, as  there is no pneumonia on chest CT or chest x-ray.  He is to continue his  home medications with blood pressure and lipid lowering medications per  Dr. Jonny Ruiz, and we will add Naprosyn 500 mg p.o. b.i.d. to his medication  regimen.  He is to follow up with Dr. Juanda Chance and with Dr. Jonny Ruiz.   DISCHARGE MEDICATIONS:  1. Micardis HCT 80/12.5 with a recent increase, dose unavailable per      Dr. Jonny Ruiz.  2. Lovastatin 40 mg a day.  3. Taztia 240 mg a day.  4. Urso Forte 500 mg 2 tablets a.m., 1 tablet p.m.  5. Naprosyn 500 mg p.o. b.i.d. x1 week, then b.i.d. p.r.n.   DICTATED BY:  Theodore Demark, PA-C      Everardo Beals Juanda Chance, MD, [facility]  Electronically Signed     BRB/MEDQ  D:  01/13/2007  T:  01/14/2007  Job:  [id number]   cc:   Corwin Levins, MD

## 2011-04-26 NOTE — H&P (Signed)
[name], [name]              ACCOUNT NO.:  [id number]   MEDICAL RECORD NO.:  [id number]          PATIENT TYPE:  INP   LOCATION:  6526                         FACILITY:  [facility]   PHYSICIAN:  Everardo Beals. Juanda Chance, MD, FACCDATE OF BIRTH:  05-Mar-1966   DATE OF ADMISSION:  01/12/2007  DATE OF DISCHARGE:                              HISTORY & PHYSICAL   PRIMARY CARE PHYSICIAN:  Corwin Levins, MD   CHIEF COMPLAINT:  Chest pain.   CLINICAL HISTORY:  [name] is a 45 year old who has no prior history  of known heart disease.  He saw Dr. Efrain Sella today for chest pain and  some mild shortness of breath.  This began Saturday and persisted  through most of the day into this morning.  Dr. Jonny Ruiz did he did an x-ray  ray which showed some basilar atelectasis and felt that he had  bronchitis and possible pericarditis.  His ECG showed ST elevation in 1-  2 AVL and V4-V6.   [name] said that his pain is substernal and sharp and he said that  it is worse when he takes a deep breath and it is better when he is  sitting up and worse when he is laying down.   PAST MEDICAL HISTORY:  His past medical history is significant for  hypertension and hyperlipidemia.  He also has a history of primary  biliary cirrhosis and is followed by Dr. Jarold Motto in [facility].   CURRENT MEDICATIONS:  1. Micardis.  2. Lovastatin.  3. Taztia for blood pressure.  4. Uracil forte.   FAMILY HISTORY:  Positive for heart disease and that his mother had  fluid around her heart.  There is no history of heart attacks in the  family.   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  He  was schedule to travel out of town either today or tomorrow in his work.  He does not smoke.  He is married and has 3 children.   REVIEW OF SYSTEMS:  Is positive for recent shortness of breath.   PHYSICAL EXAMINATION:  GENERAL:  On examination today the blood pressure  is 118/74 and a pulse of 97 and regular.   There was no venous  distension.  The carotid pulses were full without bruits.  CHEST:  Was mostly clear with minimal rales at the bases.  HEART:  Cardiac rhythm was regular.  The heart sounds were normal.  I  could hear no pericardial rubs with inspiration with him laying or  sitting.  I could also hear no murmurs or gallops.  ABDOMEN:  Was soft with normal bowel sounds.  There was no  hepatosplenomegaly.  The peripheral pulses were full.  There was no  peripheral edema.  MUSCULOSKELETAL SYSTEM:  Showed no deformities.  SKIN:  The skin was warm and dry.  NEUROLOGIC EXAMINATION:  Showed no focal neurological findings.   Electrocardiogram showed 1 mm ST elevation and 1-2 AVL and V4-V6.  His  oxygen saturation was 91% per Dr. Jonny Ruiz on room air.   Chest x-ray showed elevated bilateral hemidiaphragms  with 5 base basilar  atelectasis.   IMPRESSION:  1. Chest pain suggestive of pericarditis with abnormal ECGs.  2. Abnormal x-ray with bilateral lower lobe atelectasis.  3. Hypertension.  4. Hyperlipidemia.   RECOMMENDATIONS:  [name] symptoms are most suggestive of  pericarditis but I am also concerned about the possibility of ishemic  heart disease since his electrocardiogram is not totally typical and  seems to localize to his lateral leads.  I think that it is possible  that these could be ischemic changes.  He also has bilateral lower lobe  atelectasis and I am also concerned about the possibility of pulmonary  embolus if we do not find any other cause for his problems.  We will  plan to admit him to the hospital and evaluate him with a 2D echo and  cardiac catheterization as well as admission laboratory studies.      Bruce Elvera Lennox Juanda Chance, MD, [facility]  Electronically Signed     BRB/MEDQ  D:  01/12/2007  T:  01/13/2007  Job:  [id number]

## 2011-07-15 ENCOUNTER — Encounter: Payer: Self-pay | Admitting: Internal Medicine

## 2011-07-15 ENCOUNTER — Ambulatory Visit (INDEPENDENT_AMBULATORY_CARE_PROVIDER_SITE_OTHER): Payer: BC Managed Care – PPO | Admitting: Internal Medicine

## 2011-07-15 VITALS — BP 132/70 | HR 76 | Temp 99.2°F | Ht 69.0 in | Wt 235.2 lb

## 2011-07-15 DIAGNOSIS — I10 Essential (primary) hypertension: Secondary | ICD-10-CM

## 2011-07-15 DIAGNOSIS — J309 Allergic rhinitis, unspecified: Secondary | ICD-10-CM

## 2011-07-15 DIAGNOSIS — J209 Acute bronchitis, unspecified: Secondary | ICD-10-CM

## 2011-07-15 MED ORDER — LEVOFLOXACIN 500 MG PO TABS: 500.0000 mg | ORAL_TABLET | Freq: Every day | ORAL | Status: AC

## 2011-07-15 MED ORDER — HYDROCODONE-HOMATROPINE 5-1.5 MG/5ML PO SYRP: 5.0000 mL | ORAL_SOLUTION | Freq: Four times a day (QID) | ORAL | Status: AC | PRN

## 2011-07-15 NOTE — Patient Instructions (Signed)
Take all new medications as prescribed Continue all other medications as before  

## 2011-07-15 NOTE — Assessment & Plan Note (Signed)
Mild to mod, for antibx course,  to f/u any worsening symptoms or concerns, noted family member x 2 with pna with similar symtpoms, declines cxr today

## 2011-07-21 ENCOUNTER — Encounter: Payer: Self-pay | Admitting: Internal Medicine

## 2011-07-21 NOTE — Progress Notes (Signed)
Subjective:    Patient ID: [name], male    DOB: 09/30/1966, 45 y.o.   MRN: [id number]  HPI  Here with acute onset mild to mod 2-3 days ST, HA, general weakness and malaise, with prod cough greenish sputum, but Pt denies chest pain, increased sob or doe, wheezing, orthopnea, PND, increased LE swelling, palpitations, dizziness or syncope.  Pt denies new neurological symptoms such as new headache, or facial or extremity weakness or numbness   Pt denies polydipsia, polyuria.  Does have several wks ongoing nasal allergy symptoms with clear congestion, itch and sneeze, without fever, pain, ST, cough or wheezing. Past Medical History  Diagnosis Date  . HYPERCHOLESTEROLEMIA 08/06/2007  . HYPERLIPIDEMIA 08/15/2007  . GOUT 01/22/2008  . ERECTILE DYSFUNCTION 08/06/2007  . HYPERTENSION 08/15/2007  . ALLERGIC RHINITIS 01/22/2008  . GERD 01/22/2008  . Biliary cirrhosis 08/06/2007  . EPIDIDYMITIS 02/03/2009  . SKIN LESION 09/22/2009  . NECK SPRAIN AND STRAIN 09/22/2009  . 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 S/p septal nasal repair   . S/p sinus polyp removed     reports that he has never smoked. He does not have any smokeless tobacco history on file. He reports that he drinks alcohol. He reports that he does not use illicit drugs. family history includes Diabetes in his mother; Hyperlipidemia in his mother; Hypertension in his mother; and Stroke in his father. Allergies  Allergen Reactions  . Amlodipine Besylate   . Codeine   . Penicillins    Current Outpatient Prescriptions on File Prior to Visit  Medication Sig Dispense Refill  . aspirin 81 MG EC tablet Take 81 mg by mouth daily.        Marland Kitchen BENICAR HCT 40-12.5 MG per tablet TAKE 1 TABLET BY MOUTH EVERY DAY  30 tablet  0  . diltiazem (TIAZAC) 300 MG 24 hr capsule Take 1 capsule (300 mg total) by mouth daily.  90 capsule  3  . lovastatin (MEVACOR) 40 MG tablet  TAKE 1 TABLET BY MOUTH EVERY DAY  30 tablet  0  . ursodiol (URSO FORTE) 500 MG tablet 2 tablets by mouth in the morning and 1 tablet at bedtime       . fexofenadine (ALLEGRA) 180 MG tablet Take 180 mg by mouth daily as needed.         Review of Systems Review of Systems  Constitutional: Negative for diaphoresis and unexpected weight change.  HENT: Negative for drooling and tinnitus.   Eyes: Negative for photophobia and visual disturbance.  Respiratory: Negative for choking and stridor.   Gastrointestinal: Negative for vomiting and blood in stool.     Objective:   Physical Exam BP 132/70  Pulse 76  Temp(Src) 99.2 F (37.3 C) (Oral)  Ht 5\' 9"  (1.753 m)  Wt 235 lb 4 oz (106.709 kg)  BMI 34.74 kg/m2  SpO2 96% Physical Exam  VS noted, mild ill Constitutional: Pt appears well-developed and well-nourished.  HENT: Head: Normocephalic.  Right Ear: External ear normal.  Left Ear: External ear normal.  Bilat tm's mild erythema.  Sinus nontender.  Pharynx mild erythema Eyes: Conjunctivae and EOM are normal. Pupils are equal, round, and reactive to light.  Neck: Normal range of motion. Neck supple.  Cardiovascular: Normal rate and regular rhythm.   Pulmonary/Chest: Effort normal and breath sounds normal.  Neurological: Pt is alert. No cranial nerve deficit.  Skin: Skin is warm. No  erythema.  Psychiatric: Pt behavior is normal. Thought content normal.         Assessment & Plan:

## 2011-07-21 NOTE — Assessment & Plan Note (Signed)
stable overall by hx and exam, most recent data reviewed with pt, and pt to continue medical treatment as before  BP Readings from Last 3 Encounters:  07/15/11 132/70  04/05/11 144/78  02/14/10 124/68

## 2011-07-21 NOTE — Assessment & Plan Note (Signed)
To re-start the allegra prn,  to f/u any worsening symptoms or concerns

## 2011-12-10 HISTORY — PX: SHOULDER SURGERY: SHX246

## 2012-02-11 ENCOUNTER — Ambulatory Visit (INDEPENDENT_AMBULATORY_CARE_PROVIDER_SITE_OTHER): Payer: BC Managed Care – PPO | Admitting: Endocrinology

## 2012-02-11 ENCOUNTER — Encounter: Payer: Self-pay | Admitting: Endocrinology

## 2012-02-11 ENCOUNTER — Telehealth: Payer: Self-pay | Admitting: *Deleted

## 2012-02-11 VITALS — BP 122/82 | HR 76 | Temp 98.3°F | Ht 69.0 in | Wt 230.8 lb

## 2012-02-11 DIAGNOSIS — M25512 Pain in left shoulder: Secondary | ICD-10-CM | POA: Insufficient documentation

## 2012-02-11 DIAGNOSIS — M25519 Pain in unspecified shoulder: Secondary | ICD-10-CM

## 2012-02-11 MED ORDER — TRAMADOL HCL 50 MG PO TABS: 50.0000 mg | ORAL_TABLET | ORAL | Status: AC | PRN

## 2012-02-11 NOTE — Telephone Encounter (Signed)
Pt c/o left shoulder pain and knot at shoulder and also pain and swelling in left hand. Pt states this has happened before but it seems to be getting worse. Pt transferred to scheduler to schedule appointment today.

## 2012-02-11 NOTE — Progress Notes (Signed)
  Subjective:    Patient ID: [name], male    DOB: Mar 07, 1966, 46 y.o.   MRN: [id number]  HPI Pt states 5 days of intermittent moderate pain at the left shoulder, and assoc swelling.  He is unable to cite precip factor such as local injury.  Pain radiates down the left upper extremity, to the left thumb.  sxs are worsened with reaching across the front of his body.  He had similar sxs a few years ago (resolved without rx). Past Medical History  Diagnosis Date  . HYPERCHOLESTEROLEMIA 08/06/2007  . HYPERLIPIDEMIA 08/15/2007  . GOUT 01/22/2008  . ERECTILE DYSFUNCTION 08/06/2007  . HYPERTENSION 08/15/2007  . ALLERGIC RHINITIS 01/22/2008  . GERD 01/22/2008  . Biliary cirrhosis 08/06/2007  . EPIDIDYMITIS 02/03/2009  . SKIN LESION 09/22/2009  . NECK SPRAIN AND STRAIN 09/22/2009  . 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 Primary biliary cirrhosis 04/05/2011    Past Surgical History  Procedure Date  . S/p septal nasal repair   . S/p sinus polyp removed     History   Social History  . Marital Status: Married    Spouse Name: N/A    Number of Children: 3  . Years of Education: N/A   Occupational History  . sales protective clothing    Social History Main Topics  . Smoking status: Never Smoker   . Smokeless tobacco: Not on file  . Alcohol Use: Yes  . Drug Use: No  . Sexually Active: Not on file   Other Topics Concern  . Not on file   Social History Narrative  . No narrative on file    Current Outpatient Prescriptions on File Prior to Visit  Medication Sig Dispense Refill  . aspirin 81 MG EC tablet Take 81 mg by mouth daily.        Marland Kitchen BENICAR HCT 40-12.5 MG per tablet TAKE 1 TABLET BY MOUTH EVERY DAY  30 tablet  0  . diltiazem (TIAZAC) 300 MG 24 hr capsule Take 1 capsule (300 mg total) by mouth daily.  90 capsule  3  . lovastatin (MEVACOR) 40 MG tablet TAKE 1 TABLET BY MOUTH EVERY DAY  30 tablet  0  . ursodiol (URSO FORTE) 500 MG tablet 2 tablets by mouth in the morning and  1 tablet at bedtime         Allergies  Allergen Reactions  . Amlodipine Besylate   . Codeine   . Penicillins     Family History  Problem Relation Age of Onset  . Diabetes Mother   . Hypertension Mother   . Hyperlipidemia Mother   . Stroke Father     BP 122/82  Pulse 76  Temp(Src) 98.3 F (36.8 C) (Oral)  Ht 5\' 9"  (1.753 m)  Wt 230 lb 12.8 oz (104.69 kg)  BMI 34.08 kg/m2  SpO2 98%    Review of Systems Denies rash and fever.      Objective:   Physical Exam VITAL SIGNS:  See vs page GENERAL: no distress Left shoulder:  Tender anteriorly rom is severely limited by pain.   Left hand:  Neuro: sensation is intact to touch.  Motor 5/5       Assessment & Plan:  Shoulder pain, uncertain etiology.  new

## 2012-02-11 NOTE — Patient Instructions (Addendum)
Refer to an orthopedic specialist.  you will receive a phone call, about a day and time for an appointment. Here are some sample bottles of "pennsaid."  Place 10 drops 4 x a day. i have sent a prescription to your pharmacy, for "tramadol," to take as needed for pain.

## 2012-02-14 ENCOUNTER — Other Ambulatory Visit: Payer: Self-pay | Admitting: Sports Medicine

## 2012-02-14 DIAGNOSIS — M25512 Pain in left shoulder: Secondary | ICD-10-CM

## 2012-02-19 ENCOUNTER — Ambulatory Visit
Admission: RE | Admit: 2012-02-19 | Discharge: 2012-02-19 | Disposition: A | Discharge status: Home / Self Care | Payer: BC Managed Care – PPO | Source: Ambulatory Visit | Attending: Sports Medicine | Admitting: Sports Medicine

## 2012-02-19 DIAGNOSIS — M25512 Pain in left shoulder: Secondary | ICD-10-CM

## 2012-02-19 MED ORDER — IOHEXOL 180 MG/ML  SOLN
20.0000 mL | Freq: Once | INTRAMUSCULAR | Status: AC | PRN
  Administered 2012-02-19: 20 mL via INTRA_ARTICULAR

## 2012-03-30 ENCOUNTER — Other Ambulatory Visit: Payer: BC Managed Care – PPO

## 2012-04-03 ENCOUNTER — Other Ambulatory Visit: Payer: Self-pay

## 2012-04-03 MED ORDER — LOVASTATIN 40 MG PO TABS: 40.0000 mg | ORAL_TABLET | Freq: Every day | ORAL | Status: DC

## 2012-04-03 MED ORDER — OLMESARTAN MEDOXOMIL-HCTZ 40-12.5 MG PO TABS: 1.0000 | ORAL_TABLET | Freq: Every day | ORAL | Status: DC

## 2012-04-03 MED ORDER — DILTIAZEM HCL ER BEADS 300 MG PO CP24: 300.0000 mg | ORAL_CAPSULE | Freq: Every day | ORAL | Status: DC

## 2012-04-05 ENCOUNTER — Other Ambulatory Visit: Payer: Self-pay | Admitting: Internal Medicine

## 2012-04-07 ENCOUNTER — Encounter: Payer: BC Managed Care – PPO | Admitting: Internal Medicine

## 2012-04-15 ENCOUNTER — Other Ambulatory Visit (INDEPENDENT_AMBULATORY_CARE_PROVIDER_SITE_OTHER): Payer: BC Managed Care – PPO

## 2012-04-15 DIAGNOSIS — Z0000 Encounter for general adult medical examination without abnormal findings: Secondary | ICD-10-CM

## 2012-04-15 DIAGNOSIS — M109 Gout, unspecified: Secondary | ICD-10-CM

## 2012-04-15 LAB — LIPID PANEL
Cholesterol: 129 mg/dL (ref 0–200)
HDL: 33 mg/dL — ABNORMAL LOW (ref >39.00)
Triglycerides: 142 mg/dL (ref 0.0–149.0)

## 2012-04-15 LAB — CBC WITH DIFFERENTIAL/PLATELET
Basophils Absolute: 0 10*3/uL (ref 0.0–0.1)
HCT: 41 % (ref 39.0–52.0)
Hemoglobin: 14 g/dL (ref 13.0–17.0)
Lymphs Abs: 1.3 10*3/uL (ref 0.7–4.0)
Monocytes Relative: 22.4 % — ABNORMAL HIGH (ref 3.0–12.0)
Neutro Abs: 2.1 10*3/uL (ref 1.4–7.7)
RDW: 13.6 % (ref 11.5–14.6)

## 2012-04-15 LAB — HEPATIC FUNCTION PANEL
ALT: 42 U/L (ref 0–53)
AST: 34 U/L (ref 0–37)
Albumin: 3.8 g/dL (ref 3.5–5.2)
Total Protein: 7.8 g/dL (ref 6.0–8.3)

## 2012-04-15 LAB — URINALYSIS, ROUTINE W REFLEX MICROSCOPIC
Bilirubin Urine: NEGATIVE
Ketones, ur: NEGATIVE
Leukocytes, UA: NEGATIVE
Urobilinogen, UA: 0.2 (ref 0.0–1.0)
pH: 6 (ref 5.0–8.0)

## 2012-04-15 LAB — BASIC METABOLIC PANEL
CO2: 28 mEq/L (ref 19–32)
Calcium: 8.9 mg/dL (ref 8.4–10.5)
GFR: 166.94 mL/min (ref >60.00)
Sodium: 139 mEq/L (ref 135–145)

## 2012-04-15 LAB — TSH: TSH: 1.01 u[IU]/mL (ref 0.35–5.50)

## 2012-04-15 LAB — PSA: PSA: 0.67 ng/mL (ref 0.10–4.00)

## 2012-04-17 ENCOUNTER — Ambulatory Visit (INDEPENDENT_AMBULATORY_CARE_PROVIDER_SITE_OTHER): Payer: BC Managed Care – PPO | Admitting: Internal Medicine

## 2012-04-17 ENCOUNTER — Encounter: Payer: Self-pay | Admitting: Internal Medicine

## 2012-04-17 VITALS — BP 132/80 | HR 69 | Temp 98.6°F | Resp 16 | Wt 230.0 lb

## 2012-04-17 DIAGNOSIS — Z0000 Encounter for general adult medical examination without abnormal findings: Secondary | ICD-10-CM

## 2012-04-17 DIAGNOSIS — K745 Biliary cirrhosis, unspecified: Secondary | ICD-10-CM

## 2012-04-17 DIAGNOSIS — K743 Primary biliary cirrhosis: Secondary | ICD-10-CM

## 2012-04-17 MED ORDER — LOVASTATIN 40 MG PO TABS: 40.0000 mg | ORAL_TABLET | Freq: Every day | ORAL | Status: DC

## 2012-04-17 MED ORDER — DILTIAZEM HCL ER COATED BEADS 300 MG PO CP24: 300.0000 mg | ORAL_CAPSULE | Freq: Every day | ORAL | Status: DC

## 2012-04-17 MED ORDER — MOMETASONE FUROATE 50 MCG/ACT NA SUSP: 2.0000 | Freq: Every day | NASAL | Status: DC

## 2012-04-17 MED ORDER — OLMESARTAN MEDOXOMIL-HCTZ 40-12.5 MG PO TABS: 1.0000 | ORAL_TABLET | Freq: Every day | ORAL | Status: DC

## 2012-04-17 MED ORDER — URSODIOL 500 MG PO TABS: ORAL_TABLET | ORAL | Status: DC

## 2012-04-17 MED ORDER — LORATADINE 10 MG PO TABS: 10.0000 mg | ORAL_TABLET | Freq: Every day | ORAL | Status: DC | PRN

## 2012-04-17 NOTE — Patient Instructions (Signed)
Continue all other medications as before You are given the refills today - sent to your pharmacy, including the nasonex You are otherwise up to date with prevention Please continue your efforts at being more active, low cholesterol diet, and weight control. Please keep your appointments with your specialists as you have planned - GI at [facility] Please return in 1 year for your yearly visit, or sooner if needed, with Lab testing done 3-5 days before

## 2012-04-17 NOTE — Progress Notes (Signed)
Subjective:    Patient ID: [name], male    DOB: 06/07/1966, 46 y.o.   MRN: [id number]  HPI  Here for wellness and f/u;  Overall doing ok;  Pt denies CP, worsening SOB, DOE, wheezing, orthopnea, PND, worsening LE edema, palpitations, dizziness or syncope.  Pt denies neurological change such as new Headache, facial or extremity weakness.  Pt denies polydipsia, polyuria, or low sugar symptoms. Pt states overall good compliance with treatment and medications, good tolerability, and trying to follow lower cholesterol diet.  Pt denies worsening depressive symptoms, suicidal ideation or panic. No fever, wt loss, night sweats, loss of appetite, or other constitutional symptoms.  Pt states good ability with ADL's, low fall risk, home safety reviewed and adequate, no significant changes in hearing or vision, and occasionally active with exercise.  No acute complaints.  Needs med refills Past Medical History  Diagnosis Date  . HYPERCHOLESTEROLEMIA 08/06/2007  . HYPERLIPIDEMIA 08/15/2007  . GOUT 01/22/2008  . ERECTILE DYSFUNCTION 08/06/2007  . HYPERTENSION 08/15/2007  . ALLERGIC RHINITIS 01/22/2008  . GERD 01/22/2008  . Biliary cirrhosis 08/06/2007  . EPIDIDYMITIS 02/03/2009  . SKIN LESION 09/22/2009  . NECK SPRAIN AND STRAIN 09/22/2009  . 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 S/p septal nasal repair   . S/p sinus polyp removed   . Left labrum tear surgury mar 2013     reports that he has never smoked. He has never used smokeless tobacco. He reports that he drinks alcohol. He reports that he does not use illicit drugs. family history includes Diabetes in his mother; Hyperlipidemia in his mother; Hypertension in his mother; and Stroke in his father. Allergies  Allergen Reactions  . Amlodipine Besylate   . Codeine   . Penicillins    Current Outpatient Prescriptions on File Prior to Visit  Medication Sig Dispense  Refill  . aspirin 81 MG EC tablet Take 81 mg by mouth daily.        Marland Kitchen DISCONTD: BENICAR HCT 40-12.5 MG per tablet TAKE 1 TABLET BY MOUTH DAILY.  90 tablet  0  . DISCONTD: diltiazem (CARDIZEM CD) 300 MG 24 hr capsule TAKE 1 CAPSULE (300 MG TOTAL) BY MOUTH DAILY.  90 capsule  0  . DISCONTD: loratadine (CLARITIN) 10 MG tablet Take 10 mg by mouth daily as needed.      Marland Kitchen DISCONTD: lovastatin (MEVACOR) 40 MG tablet Take 1 tablet (40 mg total) by mouth at bedtime.  90 tablet  0  . DISCONTD: olmesartan-hydrochlorothiazide (BENICAR HCT) 40-12.5 MG per tablet Take 1 tablet by mouth daily.  90 tablet  0  . mometasone (NASONEX) 50 MCG/ACT nasal spray 2 sprays by Nasal route daily.  180 Act  3  . DISCONTD: lovastatin (MEVACOR) 40 MG tablet TAKE 1 TABLET (40 MG TOTAL) BY MOUTH DAILY.  90 tablet  0   Review of Systems Review of Systems  Constitutional: Negative for diaphoresis, activity change, appetite change and unexpected weight change.  HENT: Negative for hearing loss, ear pain, facial swelling, mouth sores and neck stiffness.   Eyes: Negative for pain, redness and visual disturbance.  Respiratory: Negative for shortness of breath and wheezing.   Cardiovascular: Negative for chest pain and palpitations.  Gastrointestinal: Negative for diarrhea, blood in stool, abdominal distention and rectal pain.  Genitourinary: Negative for hematuria, flank pain and decreased urine volume.  Musculoskeletal: Negative for myalgias and joint swelling.  Skin: Negative  for color change and wound.  Neurological: Negative for syncope and numbness.  Hematological: Negative for adenopathy.  Psychiatric/Behavioral: Negative for hallucinations, self-injury, decreased concentration and agitation.      Objective:   Physical Exam BP 132/80  Pulse 69  Temp(Src) 98.6 F (37 C) (Oral)  Resp 16  Wt 230 lb (104.327 kg)  SpO2 99% Physical Exam  VS noted Constitutional: Pt is oriented to person, place, and time. Appears  well-developed and well-nourished.  HENT:  Head: Normocephalic and atraumatic.  Right Ear: External ear normal.  Left Ear: External ear normal.  Nose: Nose normal.  Mouth/Throat: Oropharynx is clear and moist.  Eyes: Conjunctivae and EOM are normal. Pupils are equal, round, and reactive to light.  Neck: Normal range of motion. Neck supple. No JVD present. No tracheal deviation present.  Cardiovascular: Normal rate, regular rhythm, normal heart sounds and intact distal pulses.   Pulmonary/Chest: Effort normal and breath sounds normal.  Abdominal: Soft. Bowel sounds are normal. There is no tenderness.  Musculoskeletal: Normal range of motion. Exhibits no edema.  Lymphadenopathy:  Has no cervical adenopathy.  Neurological: Pt is alert and oriented to person, place, and time. Pt has normal reflexes. No cranial nerve deficit.  Skin: Skin is warm and dry. No rash noted.  Psychiatric:  Has  normal mood and affect. Behavior is normal.     Assessment & Plan:

## 2012-07-05 ENCOUNTER — Other Ambulatory Visit: Payer: Self-pay | Admitting: Internal Medicine

## 2012-10-12 ENCOUNTER — Other Ambulatory Visit: Payer: Self-pay | Admitting: Internal Medicine

## 2012-12-08 ENCOUNTER — Other Ambulatory Visit: Payer: Self-pay | Admitting: Internal Medicine

## 2013-03-05 ENCOUNTER — Ambulatory Visit (INDEPENDENT_AMBULATORY_CARE_PROVIDER_SITE_OTHER): Payer: BC Managed Care – PPO | Admitting: Internal Medicine

## 2013-03-05 ENCOUNTER — Encounter: Payer: Self-pay | Admitting: Internal Medicine

## 2013-03-05 VITALS — BP 138/82 | HR 92 | Temp 97.0°F | Ht 69.0 in | Wt 228.0 lb

## 2013-03-05 DIAGNOSIS — Z9109 Other allergy status, other than to drugs and biological substances: Secondary | ICD-10-CM

## 2013-03-05 DIAGNOSIS — I10 Essential (primary) hypertension: Secondary | ICD-10-CM

## 2013-03-05 DIAGNOSIS — K219 Gastro-esophageal reflux disease without esophagitis: Secondary | ICD-10-CM

## 2013-03-05 DIAGNOSIS — E785 Hyperlipidemia, unspecified: Secondary | ICD-10-CM

## 2013-03-05 DIAGNOSIS — J069 Acute upper respiratory infection, unspecified: Secondary | ICD-10-CM

## 2013-03-05 MED ORDER — LOVASTATIN 40 MG PO TABS: ORAL_TABLET | ORAL | Status: DC

## 2013-03-05 MED ORDER — DILTIAZEM HCL ER COATED BEADS 300 MG PO CP24: 300.0000 mg | ORAL_CAPSULE | Freq: Every day | ORAL | Status: DC

## 2013-03-05 MED ORDER — OLMESARTAN MEDOXOMIL-HCTZ 40-12.5 MG PO TABS: 1.0000 | ORAL_TABLET | Freq: Every day | ORAL | Status: DC

## 2013-03-05 MED ORDER — URSODIOL 500 MG PO TABS: ORAL_TABLET | ORAL | Status: DC

## 2013-03-05 MED ORDER — MOMETASONE FUROATE 50 MCG/ACT NA SUSP: 2.0000 | Freq: Every day | NASAL | Status: DC

## 2013-03-05 MED ORDER — AZITHROMYCIN 250 MG PO TABS: ORAL_TABLET | ORAL | Status: DC

## 2013-03-05 NOTE — Assessment & Plan Note (Signed)
Well controlled on current therapy Medication refilled today 

## 2013-03-05 NOTE — Patient Instructions (Signed)

## 2013-03-05 NOTE — Progress Notes (Signed)
HPI  Pt presents to the clinic today with c/o cold symptoms x 2 days. The worst part is the sore throat and dry cough. He does not produce any sputum. He denies fever but does have chills and body aches. He has tried Mucinex. He does have a history of allergies but no asthma. He does have sick contacts. Additionally today, he needs refills of all of his medications.  Review of Systems      Past Medical History  Diagnosis Date  . HYPERCHOLESTEROLEMIA 08/06/2007  . HYPERLIPIDEMIA 08/15/2007  . GOUT 01/22/2008  . ERECTILE DYSFUNCTION 08/06/2007  . HYPERTENSION 08/15/2007  . ALLERGIC RHINITIS 01/22/2008  . GERD 01/22/2008  . Biliary cirrhosis 08/06/2007  . EPIDIDYMITIS 02/03/2009  . SKIN LESION 09/22/2009  . NECK SPRAIN AND STRAIN 09/22/2009  . 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 Diabetes Mother   . Hypertension Mother   . Hyperlipidemia Mother   . Stroke Father     History   Social History  . Marital Status: Married    Spouse Name: N/A    Number of Children: 3  . Years of Education: N/A   Occupational History  . sales protective clothing    Social History Main Topics  . Smoking status: Never Smoker   . Smokeless tobacco: Never Used  . Alcohol Use: Yes  . Drug Use: No  . Sexually Active: Not on file   Other Topics Concern  . Not on file   Social History Narrative  . No narrative on file    Allergies  Allergen Reactions  . Amlodipine Besylate   . Codeine   . Penicillins      Constitutional: Positive headache, fatigue. Denies fever or abrupt weight changes.  HEENT:  Positive sore throat. Denies eye redness, eye pain, pressure behind the eyes, facial pain, nasal congestion, ear pain, ringing in the ears, wax buildup, runny nose or bloody nose. Respiratory: Positive cough. Denies difficulty breathing or shortness of breath.  Cardiovascular: Denies chest pain, chest  tightness, palpitations or swelling in the hands or feet.   No other specific complaints in a complete review of systems (except as listed in HPI above).  Objective:   BP 138/82  Pulse 92  Temp(Src) 97 F (36.1 C) (Oral)  Ht 5\' 9"  (1.753 m)  Wt 228 lb (103.42 kg)  BMI 33.65 kg/m2  SpO2 96% Wt Readings from Last 3 Encounters:  03/05/13 228 lb (103.42 kg)  04/17/12 230 lb (104.327 kg)  02/11/12 230 lb 12.8 oz (104.69 kg)     General: Appears his stated age, well developed, well nourished in NAD. HEENT: Head: normal shape and size; Eyes: sclera white, no icterus, conjunctiva pink, PERRLA and EOMs intact; Ears: Tm's gray and intact, normal light reflex; Nose: mucosa pink and moist, septum midline; Throat/Mouth: + PND. Teeth present, mucosa erythematous and moist, no exudate noted, no lesions or ulcerations noted.  Neck: Mild cervical lymphadenopathy. Neck supple, trachea midline. No massses, lumps or thyromegaly present.  Cardiovascular: Normal rate and rhythm. S1,S2 noted.  No murmur, rubs or gallops noted. No JVD or BLE edema. No carotid bruits noted. Pulmonary/Chest: Normal effort and positive vesicular breath sounds. No respiratory distress. No wheezes, rales or ronchi noted.      Assessment & Plan:   Upper Respiratory Infection, new onset:  Get some rest and drink plenty of water Do salt water gargles for the  sore throat eRx for Azithromax x 5 days Continue Mucinex as needed  RTC as needed or if symptoms persist.

## 2013-04-02 ENCOUNTER — Other Ambulatory Visit: Payer: Self-pay | Admitting: Internal Medicine

## 2013-04-19 ENCOUNTER — Encounter: Payer: BC Managed Care – PPO | Admitting: Internal Medicine

## 2013-07-01 ENCOUNTER — Other Ambulatory Visit: Payer: Self-pay | Admitting: Internal Medicine

## 2013-10-01 ENCOUNTER — Other Ambulatory Visit: Payer: Self-pay | Admitting: Internal Medicine

## 2013-10-06 ENCOUNTER — Other Ambulatory Visit: Payer: Self-pay | Admitting: Internal Medicine

## 2013-12-09 HISTORY — PX: PERCUTANEOUS LIVER BIOPSY: SUR136

## 2014-01-30 ENCOUNTER — Other Ambulatory Visit: Payer: Self-pay | Admitting: Internal Medicine

## 2014-03-08 ENCOUNTER — Ambulatory Visit (INDEPENDENT_AMBULATORY_CARE_PROVIDER_SITE_OTHER): Payer: BC Managed Care – PPO | Admitting: Internal Medicine

## 2014-03-08 ENCOUNTER — Encounter: Payer: Self-pay | Admitting: Internal Medicine

## 2014-03-08 VITALS — BP 138/80 | HR 75 | Temp 98.2°F | Wt 229.5 lb

## 2014-03-08 DIAGNOSIS — Z9109 Other allergy status, other than to drugs and biological substances: Secondary | ICD-10-CM

## 2014-03-08 DIAGNOSIS — Z0000 Encounter for general adult medical examination without abnormal findings: Secondary | ICD-10-CM

## 2014-03-08 MED ORDER — MOMETASONE FUROATE 50 MCG/ACT NA SUSP: 2.0000 | Freq: Every day | NASAL | Status: DC

## 2014-03-08 MED ORDER — OLMESARTAN MEDOXOMIL-HCTZ 40-12.5 MG PO TABS: 1.0000 | ORAL_TABLET | Freq: Every day | ORAL | Status: DC

## 2014-03-08 MED ORDER — LORATADINE 10 MG PO TABS: 10.0000 mg | ORAL_TABLET | Freq: Every day | ORAL | Status: DC | PRN

## 2014-03-08 MED ORDER — LOVASTATIN 40 MG PO TABS: 40.0000 mg | ORAL_TABLET | Freq: Every day | ORAL | Status: DC

## 2014-03-08 MED ORDER — DILTIAZEM HCL ER COATED BEADS 300 MG PO CP24: 300.0000 mg | ORAL_CAPSULE | Freq: Every day | ORAL | Status: DC

## 2014-03-08 NOTE — Patient Instructions (Signed)
Please continue all other medications as before, and refills have been done if requested. Please have the pharmacy call with any other refills you may need.  Please continue your efforts at being more active, low cholesterol diet, and weight control. You are otherwise up to date with prevention measures today.  Please keep your appointments with your specialists as you have planned  Please go to the LAB in the Basement (turn left off the elevator) for the tests to be done at your convenience You will be contacted by phone if any changes need to be made immediately.  Otherwise, you will receive a letter about your results with an explanation, but please check with MyChart first.  Please remember to sign up for MyChart if you have not done so, as this will be important to you in the future with finding out test results, communicating by private email, and scheduling acute appointments online when needed.  Please return in 1 year for your yearly visit, or sooner if needed, with Lab testing done 3-5 days before

## 2014-03-08 NOTE — Assessment & Plan Note (Signed)

## 2014-03-08 NOTE — Progress Notes (Signed)
Subjective:    Patient ID: [name], male    DOB: August 16, 1966, 48 y.o.   MRN: [id number]  HPI Here for wellness and f/u;  Overall doing ok;  Pt denies CP, worsening SOB, DOE, wheezing, orthopnea, PND, worsening LE edema, palpitations, dizziness or syncope.  Pt denies neurological change such as new headache, facial or extremity weakness.  Pt denies polydipsia, polyuria, or low sugar symptoms. Pt states overall good compliance with treatment and medications, good tolerability, and has been trying to follow lower cholesterol diet.  Pt denies worsening depressive symptoms, suicidal ideation or panic. No fever, night sweats, wt loss, loss of appetite, or other constitutional symptoms.  Pt states good ability with ADL's, has low fall risk, home safety reviewed and adequate, no other significant changes in hearing or vision, and only occasionally active with exercise.  No current complaints Past Medical History  Diagnosis Date  . HYPERCHOLESTEROLEMIA 08/06/2007  . HYPERLIPIDEMIA 08/15/2007  . GOUT 01/22/2008  . ERECTILE DYSFUNCTION 08/06/2007  . HYPERTENSION 08/15/2007  . ALLERGIC RHINITIS 01/22/2008  . GERD 01/22/2008  . Biliary cirrhosis 08/06/2007  . EPIDIDYMITIS 02/03/2009  . SKIN LESION 09/22/2009  . NECK SPRAIN AND STRAIN 09/22/2009  . 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 S/p septal nasal repair    . S/p sinus polyp removed    . Left labrum tear surgury mar 2013      reports that he has never smoked. He has never used smokeless tobacco. He reports that he drinks alcohol. He reports that he does not use illicit drugs. family history includes Diabetes in his mother; Hyperlipidemia in his mother; Hypertension in his mother; Stroke in his father. Allergies  Allergen Reactions  . Amlodipine Besylate   . Codeine   . Penicillins    Current Outpatient Prescriptions on File Prior to Visit    Medication Sig Dispense Refill  . aspirin 81 MG EC tablet Take 81 mg by mouth daily.        . ursodiol (ACTIGALL) 500 MG tablet TAKE 2 TABLETS IN THE MORNING AND 1 TAB IN THE EVENING  270 tablet  0   No current facility-administered medications on file prior to visit.   Review of Systems Constitutional: Negative for diaphoresis, activity change, appetite change or unexpected weight change.  HENT: Negative for hearing loss, ear pain, facial swelling, mouth sores and neck stiffness.   Eyes: Negative for pain, redness and visual disturbance.  Respiratory: Negative for shortness of breath and wheezing.   Cardiovascular: Negative for chest pain and palpitations.  Gastrointestinal: Negative for diarrhea, blood in stool, abdominal distention or other pain Genitourinary: Negative for hematuria, flank pain or change in urine volume.  Musculoskeletal: Negative for myalgias and joint swelling.  Skin: Negative for color change and wound.  Neurological: Negative for syncope and numbness. other than noted Hematological: Negative for adenopathy.  Psychiatric/Behavioral: Negative for hallucinations, self-injury, decreased concentration and agitation.      Objective:   Physical Exam BP 138/80  Pulse 75  Temp(Src) 98.2 F (36.8 C) (Oral)  Wt 229 lb 8 oz (104.101 kg)  SpO2 95% VS noted,  Constitutional: Pt is oriented to person, place, and time. Appears well-developed and well-nourished.  Head: Normocephalic and atraumatic.  Right Ear: External ear normal.  Left Ear: External ear normal.  Nose: Nose normal.  Mouth/Throat: Oropharynx is clear and moist.  Eyes: Conjunctivae and EOM are normal. Pupils  are equal, round, and reactive to light.  Neck: Normal range of motion. Neck supple. No JVD present. No tracheal deviation present.  Cardiovascular: Normal rate, regular rhythm, normal heart sounds and intact distal pulses.   Pulmonary/Chest: Effort normal and breath sounds normal.  Abdominal: Soft.  Bowel sounds are normal. There is no tenderness. No HSM  Musculoskeletal: Normal range of motion. Exhibits no edema.  Lymphadenopathy:  Has no cervical adenopathy.  Neurological: Pt is alert and oriented to person, place, and time. Pt has normal reflexes. No cranial nerve deficit.  Skin: Skin is warm and dry. No rash noted.  Psychiatric:  Has  normal mood and affect. Behavior is normal.     Assessment & Plan:

## 2014-03-08 NOTE — Progress Notes (Signed)
Pre visit review using our clinic review tool, if applicable. No additional management support is needed unless otherwise documented below in the visit note. 

## 2014-03-09 ENCOUNTER — Other Ambulatory Visit (INDEPENDENT_AMBULATORY_CARE_PROVIDER_SITE_OTHER): Payer: BC Managed Care – PPO

## 2014-03-09 ENCOUNTER — Ambulatory Visit: Payer: BC Managed Care – PPO

## 2014-03-09 ENCOUNTER — Other Ambulatory Visit: Payer: Self-pay | Admitting: Internal Medicine

## 2014-03-09 ENCOUNTER — Encounter: Payer: Self-pay | Admitting: Internal Medicine

## 2014-03-09 DIAGNOSIS — R7309 Other abnormal glucose: Secondary | ICD-10-CM

## 2014-03-09 DIAGNOSIS — Z0000 Encounter for general adult medical examination without abnormal findings: Secondary | ICD-10-CM

## 2014-03-09 LAB — TSH: TSH: 1.17 u[IU]/mL (ref 0.35–5.50)

## 2014-03-09 LAB — CBC WITH DIFFERENTIAL/PLATELET
Basophils Absolute: 0 K/uL (ref 0.0–0.1)
Basophils Relative: 0.4 % (ref 0.0–3.0)
Eosinophils Absolute: 0.2 K/uL (ref 0.0–0.7)
Eosinophils Relative: 3.3 % (ref 0.0–5.0)
HCT: 42.3 % (ref 39.0–52.0)
Hemoglobin: 14.5 g/dL (ref 13.0–17.0)
Lymphocytes Relative: 34.8 % (ref 12.0–46.0)
Lymphs Abs: 1.6 K/uL (ref 0.7–4.0)
MCHC: 34.3 g/dL (ref 30.0–36.0)
MCV: 88.4 fl (ref 78.0–100.0)
Monocytes Absolute: 0.7 K/uL (ref 0.1–1.0)
Monocytes Relative: 15.9 % — ABNORMAL HIGH (ref 3.0–12.0)
Neutro Abs: 2.1 K/uL (ref 1.4–7.7)
Neutrophils Relative %: 45.6 % (ref 43.0–77.0)
Platelets: 166 K/uL (ref 150.0–400.0)
RBC: 4.79 Mil/uL (ref 4.22–5.81)
RDW: 13.1 % (ref 11.5–14.6)
WBC: 4.6 K/uL (ref 4.5–10.5)

## 2014-03-09 LAB — BASIC METABOLIC PANEL WITH GFR
BUN: 9 mg/dL (ref 6–23)
CO2: 28 meq/L (ref 19–32)
Calcium: 9 mg/dL (ref 8.4–10.5)
Chloride: 100 meq/L (ref 96–112)
Creatinine, Ser: 0.8 mg/dL (ref 0.4–1.5)
GFR: 138.59 mL/min
Glucose, Bld: 128 mg/dL — ABNORMAL HIGH (ref 70–99)
Potassium: 3.8 meq/L (ref 3.5–5.1)
Sodium: 137 meq/L (ref 135–145)

## 2014-03-09 LAB — URINALYSIS, ROUTINE W REFLEX MICROSCOPIC
Bilirubin Urine: NEGATIVE
Hgb urine dipstick: NEGATIVE
KETONES UR: NEGATIVE
Leukocytes, UA: NEGATIVE
Nitrite: NEGATIVE
PH: 6.5 (ref 5.0–8.0)
RBC / HPF: NONE SEEN (ref 0–?)
Specific Gravity, Urine: 1.01 (ref 1.000–1.030)
Total Protein, Urine: NEGATIVE
URINE GLUCOSE: NEGATIVE
Urobilinogen, UA: 0.2 (ref 0.0–1.0)

## 2014-03-09 LAB — PSA: PSA: 0.42 ng/mL (ref 0.10–4.00)

## 2014-03-09 LAB — HEPATIC FUNCTION PANEL
ALBUMIN: 3.8 g/dL (ref 3.5–5.2)
ALK PHOS: 103 U/L (ref 39–117)
ALT: 47 U/L (ref 0–53)
AST: 33 U/L (ref 0–37)
BILIRUBIN DIRECT: 0.3 mg/dL (ref 0.0–0.3)
TOTAL PROTEIN: 7.5 g/dL (ref 6.0–8.3)
Total Bilirubin: 0.9 mg/dL (ref 0.3–1.2)

## 2014-03-09 LAB — LIPID PANEL
CHOLESTEROL: 142 mg/dL (ref 0–200)
HDL: 33.8 mg/dL — ABNORMAL LOW (ref >39.00)
LDL Cholesterol: 70 mg/dL (ref 0–99)
TRIGLYCERIDES: 191 mg/dL — AB (ref 0.0–149.0)
Total CHOL/HDL Ratio: 4
VLDL: 38.2 mg/dL (ref 0.0–40.0)

## 2014-03-09 LAB — HEMOGLOBIN A1C: Hgb A1c MFr Bld: 7.3 % — ABNORMAL HIGH (ref 4.6–6.5)

## 2014-03-09 MED ORDER — METFORMIN HCL ER 500 MG PO TB24: 500.0000 mg | ORAL_TABLET | Freq: Every day | ORAL | Status: DC

## 2014-03-10 ENCOUNTER — Other Ambulatory Visit: Payer: Self-pay | Admitting: Internal Medicine

## 2014-05-12 ENCOUNTER — Encounter: Payer: Self-pay | Admitting: Internal Medicine

## 2014-05-12 ENCOUNTER — Ambulatory Visit (INDEPENDENT_AMBULATORY_CARE_PROVIDER_SITE_OTHER): Payer: BC Managed Care – PPO | Admitting: Internal Medicine

## 2014-05-12 VITALS — BP 152/78 | HR 79 | Temp 97.9°F | Wt 221.4 lb

## 2014-05-12 DIAGNOSIS — J209 Acute bronchitis, unspecified: Secondary | ICD-10-CM

## 2014-05-12 DIAGNOSIS — J029 Acute pharyngitis, unspecified: Secondary | ICD-10-CM

## 2014-05-12 DIAGNOSIS — M7580 Other shoulder lesions, unspecified shoulder: Secondary | ICD-10-CM

## 2014-05-12 DIAGNOSIS — M7542 Impingement syndrome of left shoulder: Secondary | ICD-10-CM

## 2014-05-12 DIAGNOSIS — M25819 Other specified joint disorders, unspecified shoulder: Secondary | ICD-10-CM

## 2014-05-12 MED ORDER — CLARITHROMYCIN ER 500 MG PO TB24: 1000.0000 mg | ORAL_TABLET | Freq: Every day | ORAL | Status: DC

## 2014-05-12 NOTE — Progress Notes (Signed)
Pre visit review using our clinic review tool, if applicable. No additional management support is needed unless otherwise documented below in the visit note. 

## 2014-05-12 NOTE — Patient Instructions (Signed)
Zicam Melts or Zinc lozenges as per package label for scratchy throat . Complementary options include  vitamin C 2000 mg daily; & Echinacea for 4-7 days. Report persistent or progressive fever; discolored nasal or chest secretions; or frontal headache or facial  pain.       Plain Mucinex (NOT D) for thick secretions ;force NON dairy fluids .   Nasal cleansing in the shower as discussed with lather of mild shampoo.After 10 seconds wash off lather while  exhaling through nostrils. Make sure that all residual soap is removed to prevent irritation.  Flonase OR Nasacort AQ 1 spray in each nostril twice a day as needed. Use the "crossover" technique into opposite nostril spraying toward opposite ear @ 45 degree angle, not straight up into nostril.  Use a Neti pot daily only  as needed for significant sinus congestion; going from open side to congested side . Plain Allegra (NOT D )  160 daily , Loratidine 10 mg , OR Zyrtec 10 mg @ bedtime  as needed for itchy eyes & sneezing.   Hold the statin until you've completed the antibiotic; it could raise the level of the statin.  Use an anti-inflammatory cream such as Aspercreme or Zostrix cream twice a day to the affected area as needed. In lieu of this warm moist compresses or  hot water bottle can be used. Do not apply ice . Please see Dr. Gardenia Phlegm if the shoulder symptoms persist. His phone number is [phone]

## 2014-05-12 NOTE — Progress Notes (Signed)
   Subjective:    Patient ID: [name], male    DOB: 12/24/1965, 48 y.o.   MRN: [id number]  HPI   Symptoms began 05/10/14 a sore throat, post nasal drainage and nonproductive cough. As of the next day the cough became productive of yellow sputum. He describes ongoing nasal congestion, postnasal drainage, itchy watery eyes, and sneezing. Nasal secretions are clear-yellow.The postnasal drainage and cough are worse at night. He describes the sore throat as significant.  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 His wife and daughter have had significant extrinsic seasonal allergic symptoms.  He's taken plain Mucinex Mucinex duty with loosening of the secretions. Is also used Nasonex as needed  He has never smoked. He has no history of asthma.    Review of Systems  He denies frontal headache, facial pain, or significant nasal purulence  No otic discharge  The cough is not associated with shortness breath or wheezing.    He's had some pain in left shoulder & hip in the past 2 weeks. The hip pain necessitated using a cane;but it resolved with a NSAID. Shoulder pain is a  chronic issues and has had surgery for a torn labrum in 2004. 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    Objective:   Physical Exam General appearance:good health ;well nourished; no acute distress or increased work of breathing is present.  No  lymphadenopathy about the head, neck, or axilla noted.   Eyes: No conjunctival inflammation or lid edema is present. There is no scleral icterus. Some lower lid hyperpigmentation & minimal edema  Ears:  External ear exam shows no significant lesions or deformities.  Otoscopic examination reveals clear canals, tympanic membranes are intact bilaterally without bulging, retraction, inflammation or discharge.  Nose:  External nasal examination reveals hyperpigmented nasal crease. Nasal mucosa are moist without lesions or exudates. No septal  dislocation or deviation.No obstruction to airflow.   Oral exam: Dental hygiene is good; lips and gums are healthy appearing.There is no oropharyngeal  exudate noted.   Neck:  No deformities, thyromegaly, masses, or tenderness noted.   Supple with full range of motion without pain.   Heart:  Normal rate and regular rhythm. S1 and S2 normal without gallop, murmur, click, rub or other extra sounds.   Lungs:Chest clear to auscultation; no wheezes, rhonchi,rales ,or rubs present.No increased work of breathing.    Extremities:  No cyanosis, edema, or clubbing  noted . He is able to elevate both arms straight up above his head. There is some crepitus in the left shoulder area with range of motion.   Skin: Warm & dry w/o jaundice or tenting.         Assessment & Plan:  #1 acute pharyngitis  #2 acute bronchitis  #3 hypertension, probably aggravated by decongestant  #4 chronic shoulder impingement  See orders and after visit summary

## 2014-05-12 NOTE — Progress Notes (Signed)
   Subjective:    Patient ID: [name], male    DOB: 05-28-1966, 48 y.o.   MRN: [id number]  HPI On 05/10/14 pt reports symptoms began as sore throat, PND and nonproductive cough. One day later the pt reports the cough became productive of yellow sputum. He also reports nasal congestion with clear-yellow drainage and itchy watery eyes and sneezing. The PND and cough are worse at night. The pt denies facial pain but notes the sore throat is significant.   The pt was travelling via airplane on 05/09/14 and also reports both is wife and daughter have been sick. He does report significant seasonal allergies and denies history of asthma. He has never smoked. Pt did not receive the flu shot this year.    Pt has taken mucinex and mucinex D, which he felt has loosened his secretions. He also takes claritin and use nasonex as needed.   Pt has a history of hip and shoulder arthritis. 2 weeks ago pt's L hip began hurting and caused him to need a cane for walking. The pt took advil and the pain went away, though days later his L shoulder started hurting. He has had a history of L shoulder surgery for a torn labrum. Surgery was completed at [facility] approximately 2004. 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 Pt wants to know if there are any other treatment options for the pain besides NSAIDs.   Review of Systems  Constitutional: Negative for fever, chills and fatigue.  HENT: Positive for voice change. Negative for ear discharge, ear pain and sinus pressure.   Respiratory: Negative for shortness of breath and wheezing.        No history of asthma  Cardiovascular: Negative for chest pain.  Allergic/Immunologic: Positive for environmental allergies.      Objective:   Physical Exam Gen.: Healthy and well-nourished in appearance. Alert, appropriate and cooperative throughout exam. Eyes: No corneal inflammation noted. R conjunctival and scleral erythema. Pupils equal round  reactive to light and accommodation. Extraocular motion intact.  Ears: External  ear exam reveals no significant lesions or deformities. Canals clear .TMs normal. Hearing is grossly normal bilaterally. Nose: External nasal exam reveals no deformity or inflammation. Nasal mucosa are pink and moist. No lesions or exudates noted. L turbinate erythematous and swollen.  Mouth: Oral mucosa and oropharynx reveal no lesions or exudates. Oropharynx moderately erythematous.Teeth in good repair. No sinus tenderness upon palpation.  Lungs: Normal respiratory effort; chest expands symmetrically. Lungs are clear to auscultation without rales, wheezes, or increased work of breathing. Heart: Normal rate and rhythm. Normal S1 and S2. No gallop, click, or rub. No murmur. Lymph: No cervical or axillary lymphadenopathy. Pt is tender to touch.                                                                                Assessment & Plan:  #1) allergic rhinitis; congestion, PND, and seasonal allergies, uni-lateral eye erythema are likely causing allergic rhinitis.   Would suggest switching antihistamines and recommend aggressive nasal hygiene.  #2) pharyngitis   #3) arthritis

## 2014-05-19 ENCOUNTER — Ambulatory Visit (INDEPENDENT_AMBULATORY_CARE_PROVIDER_SITE_OTHER): Payer: BC Managed Care – PPO | Admitting: Internal Medicine

## 2014-05-19 ENCOUNTER — Encounter: Payer: Self-pay | Admitting: Internal Medicine

## 2014-05-19 VITALS — BP 110/80 | HR 108 | Temp 98.4°F | Wt 222.0 lb

## 2014-05-19 DIAGNOSIS — Z800 Family history of malignant neoplasm of digestive organs: Secondary | ICD-10-CM

## 2014-05-19 DIAGNOSIS — I10 Essential (primary) hypertension: Secondary | ICD-10-CM

## 2014-05-19 DIAGNOSIS — K743 Primary biliary cirrhosis: Secondary | ICD-10-CM

## 2014-05-19 DIAGNOSIS — K625 Hemorrhage of anus and rectum: Secondary | ICD-10-CM

## 2014-05-19 DIAGNOSIS — K745 Biliary cirrhosis, unspecified: Secondary | ICD-10-CM

## 2014-05-19 NOTE — Assessment & Plan Note (Signed)
See [facility], for INR, refer local GI for the more urgent issue at hand

## 2014-05-19 NOTE — Assessment & Plan Note (Signed)
stable overall by history and exam, recent data reviewed with pt, and pt to continue medical treatment as before,  to f/u any worsening symptoms or concerns BP Readings from Last 3 Encounters:  05/19/14 110/80  05/12/14 152/78  03/08/14 138/80

## 2014-05-19 NOTE — Patient Instructions (Signed)
Please hold on taking the Aspirin 81 mg until the bleeding has stopped  Please continue all other medications as before, and refills have been done if requested.  Please have the pharmacy call with any other refills you may need.  You will be contacted regarding the referral for: Gastroenterology  Please keep your appointments with your specialists as you may have planned  Please go to the LAB in the Basement (turn left off the elevator) for the tests to be done tomorrow  You will be contacted by phone if any changes need to be made immediately.  Otherwise, you will receive a letter about your results with an explanation, but please check with MyChart first.  Please remember to sign up for MyChart if you have not done so, as this will be important to you in the future with finding out test results, communicating by private email, and scheduling acute appointments online when needed.  Please go to the ER for any bleeding that persists, or pain, weakness or dizziness

## 2014-05-19 NOTE — Progress Notes (Signed)
Subjective:    Patient ID: [name], male    DOB: Oct 26, 1966, 48 y.o.   MRN: [id number]  HPI  Here with 3 episodes of BRBPR over last 10 days, no rectal or abd pain, n/v, dizziness, fever , and actually feels the best today he's felt in weeks.  Over past 5 wks with tx for recent for left hip bursitis, the left shoudler flare pain for 1 wk, then daughter and wife with bronchitis just prior to his illness, some improved with biaxin per Dr Linna Darner.  No prior colonoscopy or overt bleeding. No bleeding diathisis, has hx of PBC. No recent INR.  Sister with colon cancer at 45yo. Has picture of commode bowl filled BRB on smartphone today taken yesterday Past Medical History  Diagnosis Date  . HYPERCHOLESTEROLEMIA 08/06/2007  . HYPERLIPIDEMIA 08/15/2007  . GOUT 01/22/2008  . ERECTILE DYSFUNCTION 08/06/2007  . HYPERTENSION 08/15/2007  . ALLERGIC RHINITIS 01/22/2008  . GERD 01/22/2008  . Biliary cirrhosis 08/06/2007  . EPIDIDYMITIS 02/03/2009  . SKIN LESION 09/22/2009  . NECK SPRAIN AND STRAIN 09/22/2009  . 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 Family history of colon cancer 05/19/2014   Past Surgical History  Procedure Laterality Date  . S/p septal nasal repair    . S/p sinus polyp removed    . Left labrum tear surgury mar 2013      reports that he has never smoked. He has never used smokeless tobacco. He reports that he drinks alcohol. He reports that he does not use illicit drugs. family history includes Diabetes in his mother; Hyperlipidemia in his mother; Hypertension in his mother; Stroke in his father. Allergies  Allergen Reactions  . Amlodipine Besylate   . Codeine   . Penicillins    Current Outpatient Prescriptions on File Prior to Visit  Medication Sig Dispense Refill  . aspirin 81 MG EC tablet Take 81 mg by mouth daily.        . clarithromycin (BIAXIN XL) 500 MG 24 hr tablet Take 2 tablets (1,000 mg total) by mouth daily.  20 tablet  0    . diltiazem (CARDIZEM CD) 300 MG 24 hr capsule Take 1 capsule (300 mg total) by mouth daily.  90 capsule  3  . loratadine (CLARITIN) 10 MG tablet Take 1 tablet (10 mg total) by mouth daily as needed.  90 tablet  3  . lovastatin (MEVACOR) 40 MG tablet Take 1 tablet (40 mg total) by mouth at bedtime.  90 tablet  3  . mometasone (NASONEX) 50 MCG/ACT nasal spray Place 2 sprays into the nose daily.  17 g  11  . olmesartan-hydrochlorothiazide (BENICAR HCT) 40-12.5 MG per tablet Take 1 tablet by mouth daily.  90 tablet  3  . ursodiol (ACTIGALL) 500 MG tablet TAKE 2 TABLETS IN THE MORNING AND 1 TAB IN THE EVENING  270 tablet  0   No current facility-administered medications on file prior to visit.   Review of Systems  Constitutional: Negative for unusual diaphoresis or other sweats  HENT: Negative for ringing in ear Eyes: Negative for double vision or worsening visual disturbance.  Respiratory: Negative for choking and stridor.   Gastrointestinal: Negative for vomiting or other signifcant bowel change Genitourinary: Negative for hematuria or decreased urine volume.  Musculoskeletal: Negative for other MSK pain or swelling Skin: Negative for color change and worsening wound.  Neurological: Negative for tremors and numbness other than noted  Psychiatric/Behavioral: Negative for decreased  concentration or agitation other than above  '    Objective:   Physical Exam BP 110/80  Pulse 108  Temp(Src) 98.4 F (36.9 C) (Oral)  Wt 222 lb (100.699 kg)  SpO2 96% VS noted,  Constitutional: Pt appears well-developed, well-nourished.  HENT: Head: NCAT.  Right Ear: External ear normal.  Left Ear: External ear normal.  Eyes: . Pupils are equal, round, and reactive to light. Conjunctivae and EOM are normal Neck: Normal range of motion. Neck supple.  Cardiovascular: Normal rate and regular rhythm.   Pulmonary/Chest: Effort normal and breath sounds normal.  Abd:  Soft, NT, ND, + BS Neurological: Pt is  alert. Not confused , motor grossly intact Skin: Skin is warm. No rash Psychiatric: Pt behavior is normal. No agitation.     Assessment & Plan:

## 2014-05-19 NOTE — Assessment & Plan Note (Signed)
Asympt, but 3 episodes small volume over 10 days, 2 in last 2 days, diff includes diveritular bleed, hemorrhoid, or even malignancy.  Was never advised for colonscopy after sister colon ca 4 yrs ago.  For GI referral, likely needs colonsocopy - r/o malignancy. For labs tomorrow including INR

## 2014-05-19 NOTE — Assessment & Plan Note (Signed)
Sister at 48yo -

## 2014-05-19 NOTE — Progress Notes (Signed)
Pre visit review using our clinic review tool, if applicable. No additional management support is needed unless otherwise documented below in the visit note. 

## 2014-05-20 ENCOUNTER — Other Ambulatory Visit (INDEPENDENT_AMBULATORY_CARE_PROVIDER_SITE_OTHER): Payer: BC Managed Care – PPO

## 2014-05-20 ENCOUNTER — Encounter: Payer: Self-pay | Admitting: Internal Medicine

## 2014-05-20 DIAGNOSIS — K745 Biliary cirrhosis, unspecified: Secondary | ICD-10-CM

## 2014-05-20 DIAGNOSIS — K743 Primary biliary cirrhosis: Secondary | ICD-10-CM

## 2014-05-20 DIAGNOSIS — Z800 Family history of malignant neoplasm of digestive organs: Secondary | ICD-10-CM

## 2014-05-20 DIAGNOSIS — K625 Hemorrhage of anus and rectum: Secondary | ICD-10-CM

## 2014-05-20 LAB — PROTIME-INR
INR: 1.2 ratio — ABNORMAL HIGH (ref 0.8–1.0)
Prothrombin Time: 13.3 s — ABNORMAL HIGH (ref 9.6–13.1)

## 2014-05-20 LAB — CBC WITH DIFFERENTIAL/PLATELET
Basophils Absolute: 0 10*3/uL (ref 0.0–0.1)
Basophils Relative: 0.4 % (ref 0.0–3.0)
EOS ABS: 0.2 10*3/uL (ref 0.0–0.7)
Eosinophils Relative: 2.8 % (ref 0.0–5.0)
HEMATOCRIT: 39.2 % (ref 39.0–52.0)
HEMOGLOBIN: 14.1 g/dL (ref 13.0–17.0)
LYMPHS ABS: 1.7 10*3/uL (ref 0.7–4.0)
Lymphocytes Relative: 30.7 % (ref 12.0–46.0)
MCHC: 36 g/dL (ref 30.0–36.0)
MCV: 90.8 fl (ref 78.0–100.0)
Monocytes Absolute: 0.8 10*3/uL (ref 0.1–1.0)
Monocytes Relative: 13.6 % — ABNORMAL HIGH (ref 3.0–12.0)
NEUTROS ABS: 2.9 10*3/uL (ref 1.4–7.7)
Neutrophils Relative %: 52.5 % (ref 43.0–77.0)
PLATELETS: 210 10*3/uL (ref 150.0–400.0)
RBC: 4.31 Mil/uL (ref 4.22–5.81)
RDW: 13 % (ref 11.5–15.5)
WBC: 5.6 10*3/uL (ref 4.0–10.5)

## 2014-05-20 LAB — BASIC METABOLIC PANEL
BUN: 9 mg/dL (ref 6–23)
CO2: 30 meq/L (ref 19–32)
Calcium: 9.2 mg/dL (ref 8.4–10.5)
Chloride: 98 mEq/L (ref 96–112)
Creatinine, Ser: 0.8 mg/dL (ref 0.4–1.5)
GFR: 142.75 mL/min (ref >60.00)
GLUCOSE: 101 mg/dL — AB (ref 70–99)
Potassium: 3.5 mEq/L (ref 3.5–5.1)
SODIUM: 137 meq/L (ref 135–145)

## 2014-05-20 LAB — HEPATIC FUNCTION PANEL
ALK PHOS: 115 U/L (ref 39–117)
ALT: 75 U/L — ABNORMAL HIGH (ref 0–53)
AST: 53 U/L — ABNORMAL HIGH (ref 0–37)
Albumin: 3.7 g/dL (ref 3.5–5.2)
BILIRUBIN DIRECT: 0.3 mg/dL (ref 0.0–0.3)
TOTAL PROTEIN: 8.4 g/dL — AB (ref 6.0–8.3)
Total Bilirubin: 0.9 mg/dL (ref 0.2–1.2)

## 2014-05-24 ENCOUNTER — Encounter: Payer: Self-pay | Admitting: Internal Medicine

## 2014-08-02 ENCOUNTER — Encounter: Payer: Self-pay | Admitting: Internal Medicine

## 2014-08-02 ENCOUNTER — Ambulatory Visit (INDEPENDENT_AMBULATORY_CARE_PROVIDER_SITE_OTHER): Payer: BC Managed Care – PPO | Admitting: Internal Medicine

## 2014-08-02 VITALS — BP 132/80 | HR 60 | Ht 69.0 in | Wt 221.8 lb

## 2014-08-02 DIAGNOSIS — K745 Biliary cirrhosis, unspecified: Secondary | ICD-10-CM

## 2014-08-02 DIAGNOSIS — Z800 Family history of malignant neoplasm of digestive organs: Secondary | ICD-10-CM

## 2014-08-02 DIAGNOSIS — K743 Primary biliary cirrhosis: Secondary | ICD-10-CM

## 2014-08-02 DIAGNOSIS — K625 Hemorrhage of anus and rectum: Secondary | ICD-10-CM

## 2014-08-02 MED ORDER — NA SULFATE-K SULFATE-MG SULF 17.5-3.13-1.6 GM/177ML PO SOLN: ORAL | Status: DC

## 2014-08-02 NOTE — Assessment & Plan Note (Signed)
Schedule colonoscopy to evaluate the bleeding The risks and benefits as well as alternatives of endoscopic procedure(s) have been discussed and reviewed. All questions answered. The patient agrees to proceed.

## 2014-08-02 NOTE — Assessment & Plan Note (Signed)
Will need colonoscopy at least every 5 yrs pending results of this colonoscopy

## 2014-08-02 NOTE — Assessment & Plan Note (Addendum)
[place] abd to evaluate further - does not appear to be decompensated I suspect he will need another liver bx to see where things are Continue ursodeoxycholic acid - double check dose on return Need paper chart from [facility] re: check serologies  Will need vaccinations most likely

## 2014-08-02 NOTE — Progress Notes (Signed)
Subjective:    Patient ID: [name], male    DOB: 1965/12/21, 48 y.o.   MRN: [id number]  HPI 5-6 episodes of painless rectal bleeding over past several months. No constipation or pain. No diarrhea. He has not had a colonoscopy. He has a sister that was found with CRCA at age 15 - she is 19 and ok. 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 Has been on ursodeoxycholic acid x years. Last [facility] care in 2011. He had initially seen Verl Blalock, MD with liver bx 2005  GI ROS o/w negative. Allergies  Allergen Reactions  . Amlodipine Besylate   . Codeine   . Penicillins    Outpatient Prescriptions Prior to Visit  Medication Sig Dispense Refill  . diltiazem (CARDIZEM CD) 300 MG 24 hr capsule Take 1 capsule (300 mg total) by mouth daily.  90 capsule  3  . loratadine (CLARITIN) 10 MG tablet Take 1 tablet (10 mg total) by mouth daily as needed.  90 tablet  3  . lovastatin (MEVACOR) 40 MG tablet Take 1 tablet (40 mg total) by mouth at bedtime.  90 tablet  3  . mometasone (NASONEX) 50 MCG/ACT nasal spray Place 2 sprays into the nose daily.  17 g  11  . olmesartan-hydrochlorothiazide (BENICAR HCT) 40-12.5 MG per tablet Take 1 tablet by mouth daily.  90 tablet  3  . ursodiol (ACTIGALL) 500 MG tablet TAKE 2 TABLETS IN THE MORNING AND 1 TAB IN THE EVENING  270 tablet  0  . aspirin 81 MG EC tablet Take 81 mg by mouth daily.        . clarithromycin (BIAXIN XL) 500 MG 24 hr tablet Take 2 tablets (1,000 mg total) by mouth daily.  20 tablet  0    Past Medical History  Diagnosis Date  . HYPERCHOLESTEROLEMIA 08/06/2007  . HYPERLIPIDEMIA 08/15/2007  . GOUT 01/22/2008  . ERECTILE DYSFUNCTION 08/06/2007  . HYPERTENSION 08/15/2007  . ALLERGIC RHINITIS 01/22/2008  . GERD 01/22/2008  . EPIDIDYMITIS 02/03/2009  . SKIN LESION 09/22/2009  . NECK SPRAIN AND STRAIN 09/22/2009  . 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 S/p septal nasal repair    . S/p sinus polyp removed    . Left labrum tear surgury mar 2013    . Percutaneous liver biopsy  2005   History   Social History  . Marital Status: Married    Spouse Name: N/A    Number of Children: 3  . Years of Education: N/A   Occupational History  . sales protective clothing    Social History Main Topics  . Smoking status: Never Smoker   . Smokeless tobacco: Never Used  . Alcohol Use: No  . Drug Use: No   Social History Narrative   Married 1 son 2 daughterd   [place]   No caffeine   Family History  Problem Relation Age of Onset  . Diabetes Mother   . Hypertension Mother   . Hyperlipidemia Mother   . Stroke Father   . Colon polyps Father   . Colon cancer Sister   . Colon polyps Sister   . Crohn's disease Sister      Review of Systems This is positive for those things mentioned in the HPI. All other review of systems are negative.  Objective:   Physical Exam General:  Well-developed, well-nourished and in no acute distress Eyes:  anicteric. ENT:   Mouth and posterior pharynx free of lesions.  Neck:   supple w/o thyromegaly or mass.  Lungs: Clear to auscultation bilaterally. Heart:  S1S2, no rubs, murmurs, gallops. Abdomen:  soft, non-tender, no hepatosplenomegaly, hernia, or mass and BS+.  Rectal: Deferred until colonoscopy Lymph:  no cervical or supraclavicular adenopathy. Extremities:   no edema Skin   no rash. Neuro:  A&O x 3.  Psych:  appropriate mood and  Affect.   Data Reviewed: Labs in EMR [facility] notes Pathology MRI/CP PCP notes Photo of blood in commode - from patient     Assessment & Plan:  BRBPR (bright red blood per rectum) Schedule colonoscopy to evaluate the bleeding The risks and benefits as well as alternatives of endoscopic procedure(s) have been discussed and reviewed. All questions answered. The patient agrees to  proceed.   Primary biliary cirrhosis - AMA negative [place] abd to evaluate further - does not appear to be decompensated I suspect he will need another liver bx to see where things are Continue ursodeoxycholic acid - double check dose on return Need paper chart from [facility] re: check serologies  Will need vaccinations most likely   Family history of colon cancer - sister age 61 Will need colonoscopy at least every 5 yrs pending results of this colonoscopy   .I appreciate the opportunity to care for this patient. ZJ:QBHAL Jenny Reichmann, MD

## 2014-08-02 NOTE — Patient Instructions (Addendum)
You have been scheduled for a colonoscopy. Please follow written instructions given to you at your visit today.  Please pick up your prep kit at the pharmacy within the next 1-3 days. If you use inhalers (even only as needed), please bring them with you on the day of your procedure. Your physician has requested that you go to www.startemmi.com and enter the access code given to you at your visit today. This web site gives a general overview about your procedure. However, you should still follow specific instructions given to you by our office regarding your preparation for the procedure.  You have been scheduled for an abdominal ultrasound at [facility] (1st floor of hospital) on 08/12/14 at 9:00am. Please arrive 15 minutes prior to your appointment for registration. Make certain not to have anything to eat or drink after midnight. Should you need to reschedule your appointment, please contact radiology at [phone]. This test typically takes about 30 minutes to perform.   I appreciate the opportunity to care for you.

## 2014-08-09 DIAGNOSIS — D126 Benign neoplasm of colon, unspecified: Secondary | ICD-10-CM

## 2014-08-09 HISTORY — DX: Benign neoplasm of colon, unspecified: D12.6

## 2014-08-11 ENCOUNTER — Ambulatory Visit (AMBULATORY_SURGERY_CENTER): Payer: BC Managed Care – PPO | Admitting: Internal Medicine

## 2014-08-11 ENCOUNTER — Encounter: Payer: Self-pay | Admitting: Internal Medicine

## 2014-08-11 VITALS — BP 140/91 | HR 68 | Temp 98.0°F | Resp 23 | Ht 69.0 in | Wt 221.0 lb

## 2014-08-11 DIAGNOSIS — D126 Benign neoplasm of colon, unspecified: Secondary | ICD-10-CM

## 2014-08-11 DIAGNOSIS — K649 Unspecified hemorrhoids: Secondary | ICD-10-CM

## 2014-08-11 DIAGNOSIS — K5730 Diverticulosis of large intestine without perforation or abscess without bleeding: Secondary | ICD-10-CM

## 2014-08-11 DIAGNOSIS — K625 Hemorrhage of anus and rectum: Secondary | ICD-10-CM

## 2014-08-11 MED ORDER — SODIUM CHLORIDE 0.9 % IV SOLN: 500.0000 mL | INTRAVENOUS | Status: DC

## 2014-08-11 NOTE — Patient Instructions (Addendum)
I found and removed one tiny polyp that looks benign. I think the bleeding cane from enlarged blood vessels in the rectum. These could be related to the liver problem you had. I can explain more after the ultrasound.  You also have a condition called diverticulosis - common and not usually a problem. Please read the handout provided.  I will let you know pathology results and when to have another routine colonoscopy by mail.  I appreciate the opportunity to care for you. Gatha Mayer, MD, FACG  YOU HAD AN ENDOSCOPIC PROCEDURE TODAY AT [facility]: Refer to the procedure report that was given to you for any specific questions about what was found during the examination.  If the procedure report does not answer your questions, please call your gastroenterologist to clarify.  If you requested that your care partner not be given the details of your procedure findings, then the procedure report has been included in a sealed envelope for you to review at your convenience later.  YOU SHOULD EXPECT: Some feelings of bloating in the abdomen. Passage of more gas than usual.  Walking can help get rid of the air that was put into your GI tract during the procedure and reduce the bloating. If you had a lower endoscopy (such as a colonoscopy or flexible sigmoidoscopy) you may notice spotting of blood in your stool or on the toilet paper. If you underwent a bowel prep for your procedure, then you may not have a normal bowel movement for a few days.  DIET: Your first meal following the procedure should be a light meal and then it is ok to progress to your normal diet.  A half-sandwich or bowl of soup is an example of a good first meal.  Heavy or fried foods are harder to digest and may make you feel nauseous or bloated.  Likewise meals heavy in dairy and vegetables can cause extra gas to form and this can also increase the bloating.  Drink plenty of fluids but you should avoid alcoholic beverages  for 24 hours.  ACTIVITY: Your care partner should take you home directly after the procedure.  You should plan to take it easy, moving slowly for the rest of the day.  You can resume normal activity the day after the procedure however you should NOT DRIVE or use heavy machinery for 24 hours (because of the sedation medicines used during the test).    SYMPTOMS TO REPORT IMMEDIATELY: A gastroenterologist can be reached at any hour.  During normal business hours, 8:30 AM to 5:00 PM Monday through Friday, call [phone].  After hours and on weekends, please call the GI answering service at [phone] who will take a message and have the physician on call contact you.   Following lower endoscopy (colonoscopy or flexible sigmoidoscopy):  Excessive amounts of blood in the stool  Significant tenderness or worsening of abdominal pains  Swelling of the abdomen that is new, acute  Fever of 100F or higher    FOLLOW UP: If any biopsies were taken you will be contacted by phone or by letter within the next 1-3 weeks.  Call your gastroenterologist if you have not heard about the biopsies in 3 weeks.  Our staff will call the home number listed on your records the next business day following your procedure to check on you and address any questions or concerns that you may have at that time regarding the information given to you following your procedure.  This is a courtesy call and so if there is no answer at the home number and we have not heard from you through the emergency physician on call, we will assume that you have returned to your regular daily activities without incident.  SIGNATURES/CONFIDENTIALITY: You and/or your care partner have signed paperwork which will be entered into your electronic medical record.  These signatures attest to the fact that that the information above on your After Visit Summary has been reviewed and is understood.  Full responsibility of the confidentiality of this  discharge information lies with you and/or your care-partner.  Await pathology  Please read handouts about polyps and hemorrhoids  Continue your normal medications

## 2014-08-11 NOTE — Op Note (Signed)
[place]  [address] [place], [address]   COLONOSCOPY PROCEDURE REPORT  PATIENT: [name], [name]  MR#: [id number] BIRTHDATE: Dec 29, 1965 , 48  yrs. old GENDER: Male ENDOSCOPIST: Gatha Mayer, MD, [facility] PROCEDURE DATE:  08/11/2014 PROCEDURE:   Colonoscopy with snare polypectomy First Screening Colonoscopy - Avg.  risk and is 50 yrs.  old or older - No.  Prior Negative Screening - Now for repeat screening. N/A  History of Adenoma - Now for follow-up colonoscopy & has been > or = to 3 yrs.  N/A  Polyps Removed Today? Yes. ASA CLASS:   Class II INDICATIONS:Rectal Bleeding. MEDICATIONS: propofol (Diprivan) 400mg  IV, MAC sedation, administered by CRNA, and These medications were titrated to patient response per physician's verbal order  DESCRIPTION OF PROCEDURE:   After the risks benefits and alternatives of the procedure were thoroughly explained, informed consent was obtained.  A digital rectal exam revealed no abnormalities of the rectum, A digital rectal exam revealed no prostatic nodules, and A digital rectal exam revealed the prostate was not enlarged.   The [facility] [id number]  endoscope was introduced through the anus and advanced to the cecum, which was identified by both the appendix and ileocecal valve. No adverse events experienced.   The quality of the prep was Suprep good  The instrument was then slowly withdrawn as the colon was fully examined.  COLON FINDINGS: A diminutive sessile polyp was found at the splenic flexure.  A polypectomy was performed with a cold snare.  The resection was complete and the polyp tissue was completely retrieved.   Moderate diverticulosis was noted in the sigmoid colon.   ? small rectal varices.   The colon mucosa was otherwise normal.  Retroflexed views revealed no other abnormalities. The time to cecum=2 minutes 14 seconds.  Withdrawal time=8 minutes 32 seconds.  The scope was withdrawn and the procedure  completed. COMPLICATIONS: There were no complications.  ENDOSCOPIC IMPRESSION: 1.   Diminutive sessile polyp was found at the splenic flexure; polypectomy was performed with a cold snare 2.   Moderate diverticulosis was noted in the sigmoid colon 3.   ? small rectal varices - would be source of bleeding 4.   The colon mucosa was otherwise normal  RECOMMENDATIONS: Timing of repeat colonoscopy will be determined by pathology findings.   eSigned:  Gatha Mayer, MD, [facility] 08/11/2014 2:20 PM   cc: Biagio Borg, MD and The Patient

## 2014-08-11 NOTE — Progress Notes (Signed)
Called to room to assist during endoscopic procedure.  Patient ID and intended procedure confirmed with present staff. Received instructions for my participation in the procedure from the performing physician.  

## 2014-08-11 NOTE — Progress Notes (Signed)
Report to PACU, RN, vss, BBS= Clear.  

## 2014-08-12 ENCOUNTER — Other Ambulatory Visit: Payer: Self-pay

## 2014-08-12 ENCOUNTER — Telehealth: Payer: Self-pay | Admitting: *Deleted

## 2014-08-12 ENCOUNTER — Ambulatory Visit (HOSPITAL_COMMUNITY)
Admission: RE | Admit: 2014-08-12 | Discharge: 2014-08-12 | Disposition: A | Discharge status: Home / Self Care | Payer: BC Managed Care – PPO | Source: Ambulatory Visit | Attending: Internal Medicine | Admitting: Internal Medicine

## 2014-08-12 DIAGNOSIS — K743 Primary biliary cirrhosis: Secondary | ICD-10-CM

## 2014-08-12 DIAGNOSIS — K745 Biliary cirrhosis, unspecified: Secondary | ICD-10-CM | POA: Diagnosis not present

## 2014-08-12 NOTE — Progress Notes (Signed)
Quick Note:  Ultrasound is suggesting he is developing cirrhosis Needs to have a liver biopsy to see status of liver (I had told him this was likely) Please schedule with IR ______

## 2014-08-12 NOTE — Telephone Encounter (Signed)
  Follow up Call-  Call back number 08/11/2014  Post procedure Call Back phone  # [phone]  Permission to leave phone message Yes     Patient questions:  Do you have a fever, pain , or abdominal swelling? No. Pain Score  0 *  Have you tolerated food without any problems? Yes.    Have you been able to return to your normal activities? Yes.    Do you have any questions about your discharge instructions: Diet   No. Medications  No. Follow up visit  No.  Do you have questions or concerns about your Care? No.  Actions: * If pain score is 4 or above: No action needed, pain <4.

## 2014-08-12 NOTE — Progress Notes (Signed)
Quick Note:  I called and explained rationale for liver bx and also possible complications (need to restage PBC) Told him we will call again next week about scheduling - he is ok to do it ______

## 2014-08-18 ENCOUNTER — Encounter: Payer: Self-pay | Admitting: Internal Medicine

## 2014-08-18 ENCOUNTER — Encounter (HOSPITAL_COMMUNITY): Payer: Self-pay | Admitting: Pharmacy Technician

## 2014-08-18 NOTE — Progress Notes (Signed)
Quick Note:  Diminutive adenoma Repeat colon 2020 ______

## 2014-08-23 ENCOUNTER — Other Ambulatory Visit: Payer: Self-pay | Admitting: Radiology

## 2014-08-24 ENCOUNTER — Other Ambulatory Visit: Payer: Self-pay | Admitting: Radiology

## 2014-08-26 ENCOUNTER — Ambulatory Visit (HOSPITAL_COMMUNITY)
Admission: RE | Admit: 2014-08-26 | Discharge: 2014-08-26 | Disposition: A | Discharge status: Home / Self Care | Payer: BC Managed Care – PPO | Source: Ambulatory Visit | Attending: Internal Medicine | Admitting: Internal Medicine

## 2014-08-26 ENCOUNTER — Encounter (HOSPITAL_COMMUNITY): Payer: Self-pay

## 2014-08-26 DIAGNOSIS — E7800 Pure hypercholesterolemia, unspecified: Secondary | ICD-10-CM | POA: Diagnosis not present

## 2014-08-26 DIAGNOSIS — K743 Primary biliary cirrhosis: Secondary | ICD-10-CM

## 2014-08-26 DIAGNOSIS — I10 Essential (primary) hypertension: Secondary | ICD-10-CM | POA: Diagnosis not present

## 2014-08-26 DIAGNOSIS — R932 Abnormal findings on diagnostic imaging of liver and biliary tract: Secondary | ICD-10-CM | POA: Diagnosis present

## 2014-08-26 DIAGNOSIS — K739 Chronic hepatitis, unspecified: Secondary | ICD-10-CM | POA: Insufficient documentation

## 2014-08-26 DIAGNOSIS — J309 Allergic rhinitis, unspecified: Secondary | ICD-10-CM | POA: Insufficient documentation

## 2014-08-26 DIAGNOSIS — Z7982 Long term (current) use of aspirin: Secondary | ICD-10-CM | POA: Insufficient documentation

## 2014-08-26 LAB — PROTIME-INR
INR: 1.08 (ref 0.00–1.49)
PROTHROMBIN TIME: 14 s (ref 11.6–15.2)

## 2014-08-26 LAB — CBC
HCT: 41.3 % (ref 39.0–52.0)
Hemoglobin: 14.3 g/dL (ref 13.0–17.0)
MCH: 30.2 pg (ref 26.0–34.0)
MCHC: 34.6 g/dL (ref 30.0–36.0)
MCV: 87.3 fL (ref 78.0–100.0)
PLATELETS: 179 10*3/uL (ref 150–400)
RBC: 4.73 MIL/uL (ref 4.22–5.81)
RDW: 12.9 % (ref 11.5–15.5)
WBC: 3.7 10*3/uL — AB (ref 4.0–10.5)

## 2014-08-26 LAB — APTT: aPTT: 33 seconds (ref 24–37)

## 2014-08-26 MED ORDER — SODIUM CHLORIDE 0.9 % IV SOLN: INTRAVENOUS | Status: DC

## 2014-08-26 MED ORDER — FENTANYL CITRATE 0.05 MG/ML IJ SOLN
INTRAMUSCULAR | Status: AC
  Filled 2014-08-26: qty 4

## 2014-08-26 MED ORDER — MIDAZOLAM HCL 2 MG/2ML IJ SOLN
INTRAMUSCULAR | Status: AC
  Filled 2014-08-26: qty 4

## 2014-08-26 MED ORDER — LIDOCAINE HCL (PF) 1 % IJ SOLN
INTRAMUSCULAR | Status: AC
  Filled 2014-08-26: qty 10

## 2014-08-26 MED ORDER — MIDAZOLAM HCL 2 MG/2ML IJ SOLN
INTRAMUSCULAR | Status: AC | PRN
  Administered 2014-08-26 (×2): 1 mg via INTRAVENOUS

## 2014-08-26 MED ORDER — GELATIN ABSORBABLE 12-7 MM EX MISC
CUTANEOUS | Status: AC
  Filled 2014-08-26: qty 1

## 2014-08-26 MED ORDER — FENTANYL CITRATE 0.05 MG/ML IJ SOLN
INTRAMUSCULAR | Status: AC | PRN
  Administered 2014-08-26 (×2): 50 ug via INTRAVENOUS

## 2014-08-26 NOTE — Sedation Documentation (Signed)
Pt awake, denied feeling effects of sedatives at this point. Order obtained to administer more.

## 2014-08-26 NOTE — Discharge Instructions (Signed)
Liver Biopsy, Care After °Refer to this sheet in the next few weeks. These instructions provide you with information on caring for yourself after your procedure. Your health care provider may also give you more specific instructions. Your treatment has been planned according to current medical practices, but problems sometimes occur. Call your health care provider if you have any problems or questions after your procedure. °WHAT TO EXPECT AFTER THE PROCEDURE °After your procedure, it is typical to have the following: °· A small amount of discomfort in the area where the biopsy was done and in the right shoulder or shoulder blade. °· A small amount of bruising around the area where the biopsy was done and on the skin over the liver. °· Sleepiness and fatigue for the rest of the day. °HOME CARE INSTRUCTIONS  °· Rest at home for 1-2 days or as directed by your health care provider. °· Have a friend or family member stay with you for at least 24 hours. °· Because of the medicines used during the procedure, you should not do the following things in the first 24 hours: °¨ Drive. °¨ Use machinery. °¨ Be responsible for the care of other people. °¨ Sign legal documents. °¨ Take a bath or shower. °· There are many different ways to close and cover an incision, including stitches, skin glue, and adhesive strips. Follow your health care provider's instructions on: °¨ Incision care. °¨ Bandage (dressing) changes and removal. °¨ Incision closure removal. °· Do not drink alcohol in the first week. °· Do not lift more than 5 pounds or play contact sports for 2 weeks after this test. °· Take medicines only as directed by your health care provider. Do not take medicine containing aspirin or non-steroidal anti-inflammatory medicines such as ibuprofen for 1 week after this test. °· It is your responsibility to get your test results. °SEEK MEDICAL CARE IF:  °· You have increased bleeding from an incision that results in more than a  small spot of blood. °· You have redness, swelling, or increasing pain in any incisions. °· You notice a discharge or a bad smell coming from any of your incisions. °· You have a fever or chills. °SEEK IMMEDIATE MEDICAL CARE IF:  °· You develop swelling, bloating, or pain in your abdomen. °· You become dizzy or faint. °· You develop a rash. °· You are nauseous or vomit. °· You have difficulty breathing, feel short of breath, or feel faint. °· You develop chest pain. °· You have problems with your speech or vision. °· You have trouble balancing or moving your arms or legs. °Document Released: 06/14/2005 Document Revised: 04/11/2014 Document Reviewed: 01/21/2014 °Exit[employer]® Patient Information ©2015 [employer]. This information is not intended to replace advice given to you by your health care provider. Make sure you discuss any questions you have with your health care provider. ° °

## 2014-08-26 NOTE — Sedation Documentation (Signed)
Dr. Earleen Newport discussing outcome of procedure with pt. Questions answered.

## 2014-08-26 NOTE — Procedures (Signed)
Procedure: US guided liver biopsy for medical liver disease.  3 x 18 G core biopsy from right liver lobe.  Local anesthesia and moderate sedation No complication No significant blood loss Stable to recovery.  3 hour observation Signed,  Johny Shears, DO

## 2014-08-26 NOTE — Sedation Documentation (Signed)
Bandaid to Rt upper lateral abdomen remains unchanged. Pt denied pain. Transferred to short stay. Wife at side.

## 2014-08-26 NOTE — Sedation Documentation (Signed)
Dr. Earleen Newport obtaining samples, awakens easily, pt denied pain.

## 2014-08-26 NOTE — H&P (Signed)
Chief Complaint: "I am here for my liver biopsy."  Referring Physician(s): Gessner,Carl E  History of Present Illness: [name] is a 48 y.o. 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 Recent US revealed findings suggestive of developing cirrhosis. Patient is scheduled today for image guided liver biopsy to evaluate for progression. He denies any chest pain, shortness of breath or palpitations. He denies any active signs of bleeding or excessive bruising. He denies any recent fever or chills. The patient denies any history of sleep apnea or chronic oxygen use. He has previously tolerated sedation without complications.    Past Medical History  Diagnosis Date  . HYPERCHOLESTEROLEMIA 08/06/2007  . HYPERLIPIDEMIA 08/15/2007  . GOUT 01/22/2008  . ERECTILE DYSFUNCTION 08/06/2007  . HYPERTENSION 08/15/2007  . ALLERGIC RHINITIS 01/22/2008  . GERD 01/22/2008  . EPIDIDYMITIS 02/03/2009  . SKIN LESION 09/22/2009  . NECK SPRAIN AND STRAIN 09/22/2009  . 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 Primary biliary cirrhosis 04/05/2011    Past Surgical History  Procedure Laterality Date  . S/p septal nasal repair    . S/p sinus polyp removed    . Left labrum tear surgury mar 2013    . Percutaneous liver biopsy  2005    Allergies: Amlodipine besylate; Codeine; and Penicillins  Medications: Prior to Admission medications   Medication Sig Start Date End Date Taking? Authorizing Provider  aspirin EC 81 MG tablet Take 81 mg by mouth daily.   Yes Historical Provider, MD  diltiazem (CARDIZEM CD) 300 MG 24 hr capsule Take 1 capsule (300 mg total) by mouth daily. 03/08/14  Yes Biagio Borg, MD  lovastatin (MEVACOR) 40 MG tablet Take 1 tablet (40 mg total) by mouth at bedtime. 03/08/14  Yes Biagio Borg, MD  Multiple Vitamin (MULTIVITAMIN WITH MINERALS) TABS tablet Take 1 tablet by mouth daily.   Yes Historical Provider, MD    olmesartan-hydrochlorothiazide (BENICAR HCT) 40-12.5 MG per tablet Take 1 tablet by mouth daily. 03/08/14  Yes Biagio Borg, MD  ursodiol (ACTIGALL) 500 MG tablet Take 500-1,000 mg by mouth 2 (two) times daily. Take 2 tablets in the am and 1 tablet in the pm   Yes Historical Provider, MD  loratadine (CLARITIN) 10 MG tablet Take 10 mg by mouth daily as needed for allergies.    Historical Provider, MD  mometasone (NASONEX) 50 MCG/ACT nasal spray Place 2 sprays into the nose daily. 03/08/14 03/08/15  Biagio Borg, MD    Family History  Problem Relation Age of Onset  . Diabetes Mother   . Hypertension Mother   . Hyperlipidemia Mother   . Stroke Father   . Colon polyps Father   . Colon cancer Sister   . Colon polyps Sister   . Crohn's disease Sister     History   Social History  . Marital Status: Married    Spouse Name: N/A    Number of Children: 3  . Years of Education: N/A   Occupational History  . sales protective clothing    Social History Main Topics  . Smoking status: Never Smoker   . Smokeless tobacco: Never Used  . Alcohol Use: No  . Drug Use: No  . Sexual Activity: None   Other Topics Concern  . None   Social History Narrative   Married 1 son 2 daughterd   [place]   No caffeine   Review of  Systems: A 12 point ROS discussed and pertinent positives are indicated in the HPI above.  All other systems are negative.  Review of Systems  Constitutional: Negative for fever and chills.  Respiratory: Negative for shortness of breath.   Cardiovascular: Negative for chest pain.  Gastrointestinal: Negative for abdominal pain and abdominal distention.  Genitourinary: Negative for hematuria.    Vital Signs: BP 144/86  Pulse 67  Temp(Src) 97.8 F (36.6 C) (Oral)  Resp 16  Ht 5\' 9"  (1.753 m)  Wt 218 lb (98.884 kg)  BMI 32.18 kg/m2  SpO2 99%  Physical Exam  Constitutional: He is oriented to person, place, and time. He appears  well-developed and well-nourished. No distress.  HENT:  Head: Normocephalic and atraumatic.  Neck: No tracheal deviation present.  Cardiovascular: Normal rate and regular rhythm.  Exam reveals no gallop and no friction rub.   No murmur heard. Pulmonary/Chest: Effort normal and breath sounds normal. No respiratory distress. He has no wheezes. He has no rales.  Abdominal: Soft. Bowel sounds are normal. He exhibits no distension and no mass. There is no tenderness.  Neurological: He is alert and oriented to person, place, and time.  Skin: Skin is warm and dry. He is not diaphoretic.  Psychiatric: He has a normal mood and affect. His behavior is normal. Thought content normal.    Imaging: US Abdomen Complete  08/12/2014   CLINICAL DATA:  48 year old male with primary biliary cirrhosis. Subsequent encounter.  EXAM: ULTRASOUND ABDOMEN COMPLETE  COMPARISON:  Abdomen ultrasound 02/20/2004.  Chest CTA 01/13/2007.  FINDINGS: Gallbladder:  No gallstones or wall thickening visualized. No sonographic Murphy sign noted.  Common bile duct:  Diameter: 5 mm, normal  Liver:  Nodular liver contour (image 14). Echogenicity within normal limits. No discrete liver lesion. No intrahepatic biliary ductal dilatation. Chronic periportal fat incidentally noted (and seen on 01/13/2007 series 4, image 93).  IVC:  No abnormality visualized.  Pancreas:  Visualized portion unremarkable.  Spleen:  Size and appearance within normal limits.  Right Kidney:  Length: 12.0 cm. Echogenicity within normal limits. No mass or hydronephrosis visualized.  Left Kidney:  Length: 13.7 cm. Echogenicity within normal limits. No mass or hydronephrosis visualized.  Abdominal aorta:  No aneurysm visualized.  Other findings:  None.  IMPRESSION: 1. Mildly nodular liver contour compatible with cirrhosis. Otherwise negative sonographic appearance of the liver. 2. No other abnormality on abdomen ultrasound.   Electronically Signed   By: Lars Pinks M.D.   On:  08/12/2014 10:09    Labs: Lab Results  Component Value Date   WBC 3.7* 08/26/2014   HCT 41.3 08/26/2014   MCV 87.3 08/26/2014   PLT 179 08/26/2014   NA 137 05/20/2014   K 3.5 05/20/2014   CL 98 05/20/2014   CO2 30 05/20/2014   GLUCOSE 101* 05/20/2014   BUN 9 05/20/2014   CREATININE 0.8 05/20/2014   CALCIUM 9.2 05/20/2014   PROT 8.4* 05/20/2014   ALBUMIN 3.7 05/20/2014   AST 53* 05/20/2014   ALT 75* 05/20/2014   ALKPHOS 115 05/20/2014   BILITOT 0.9 05/20/2014   GFRNONAA 135.01 02/13/2010   GFRAA 119 01/16/2009   INR 1.08 08/26/2014    Assessment and Plan: History of primary biliary cirrhosis s/p biopsy 2005 Scheduled today for image guided liver biopsy with moderate sedation to evaluate progression Patient has been NPO, no blood thinners taken, labs reviewed. Risks and Benefits discussed with the patient. All of the patient's questions were answered, patient is agreeable to  proceed. Consent signed and in chart.   Tsosie Billing PA-C Interventional Radiology   08/26/14  11:27 AM

## 2014-08-31 ENCOUNTER — Encounter: Payer: Self-pay | Admitting: Internal Medicine

## 2014-08-31 NOTE — Progress Notes (Signed)
Quick Note:  Let him know that the biopsy shows ongoing inflammation but no signs of cirrhosis - overall stable This is good news  I recommend he see a liver specialist again - just to go over things and make sure nothing new on horizon for Tx, etc  We should also have him do Hepatitis A antibody total and hepatitis B surface Ab and core Ab total to check immune status and he should get vaccinated if these are negative Get influenza vaccine now (pharmacy or PCP) and also if has not had pneumonia vaccine should get that given chronic liver dz  I would like him to see Dr. Monica Martinez [facility] This is not urgent ______

## 2014-09-01 ENCOUNTER — Other Ambulatory Visit: Payer: Self-pay

## 2014-09-01 DIAGNOSIS — K759 Inflammatory liver disease, unspecified: Secondary | ICD-10-CM

## 2014-09-06 NOTE — Progress Notes (Signed)
Called and left message that I would call back.

## 2014-09-09 ENCOUNTER — Other Ambulatory Visit: Payer: BC Managed Care – PPO

## 2014-09-09 DIAGNOSIS — K759 Inflammatory liver disease, unspecified: Secondary | ICD-10-CM

## 2014-09-09 LAB — HEPATITIS B CORE ANTIBODY, IGM: Hep B C IgM: NONREACTIVE

## 2014-09-09 LAB — HEPATITIS C ANTIBODY: HCV Ab: NEGATIVE

## 2014-09-09 LAB — HEPATITIS B SURFACE ANTIBODY, QUANTITATIVE: Hepatitis B-Post: 0.1 m[IU]/mL

## 2014-09-13 NOTE — Progress Notes (Signed)
Patient is scheduled for [facility] liver for 10/03/14 10:00.  Appt was scheduled with the patient and he is aware

## 2014-09-15 NOTE — Progress Notes (Signed)
Quick Note:  I reviewed things with him We need to cancel charge for Hepatitis C antibody Was supposed to get a Heopatitis A ab total - if that can be run on existing blood do it but otherwise I have told him to ask for that testing with Dr. Monica Martinez He is not immune to HBV but we will wait on the HAV testing here or with Dr. Monica Martinez before vaccinating ______

## 2015-02-27 ENCOUNTER — Other Ambulatory Visit: Payer: Self-pay | Admitting: Internal Medicine

## 2015-03-30 ENCOUNTER — Ambulatory Visit (INDEPENDENT_AMBULATORY_CARE_PROVIDER_SITE_OTHER): Payer: BLUE CROSS/BLUE SHIELD | Admitting: Internal Medicine

## 2015-03-30 ENCOUNTER — Ambulatory Visit (INDEPENDENT_AMBULATORY_CARE_PROVIDER_SITE_OTHER)
Admission: RE | Admit: 2015-03-30 | Discharge: 2015-03-30 | Disposition: A | Discharge status: Home / Self Care | Payer: BLUE CROSS/BLUE SHIELD | Source: Ambulatory Visit | Attending: Internal Medicine | Admitting: Internal Medicine

## 2015-03-30 ENCOUNTER — Encounter: Payer: Self-pay | Admitting: Internal Medicine

## 2015-03-30 ENCOUNTER — Other Ambulatory Visit (INDEPENDENT_AMBULATORY_CARE_PROVIDER_SITE_OTHER): Payer: BLUE CROSS/BLUE SHIELD

## 2015-03-30 VITALS — BP 142/86 | HR 106 | Temp 98.9°F | Resp 18 | Ht 69.0 in | Wt 222.0 lb

## 2015-03-30 DIAGNOSIS — Z0000 Encounter for general adult medical examination without abnormal findings: Secondary | ICD-10-CM

## 2015-03-30 DIAGNOSIS — E119 Type 2 diabetes mellitus without complications: Secondary | ICD-10-CM | POA: Insufficient documentation

## 2015-03-30 DIAGNOSIS — R079 Chest pain, unspecified: Secondary | ICD-10-CM | POA: Insufficient documentation

## 2015-03-30 DIAGNOSIS — J029 Acute pharyngitis, unspecified: Secondary | ICD-10-CM

## 2015-03-30 LAB — CBC WITH DIFFERENTIAL/PLATELET
BASOS ABS: 0 10*3/uL (ref 0.0–0.1)
Basophils Relative: 0.1 % (ref 0.0–3.0)
Eosinophils Absolute: 0.1 10*3/uL (ref 0.0–0.7)
Eosinophils Relative: 1.3 % (ref 0.0–5.0)
HCT: 42.3 % (ref 39.0–52.0)
HEMOGLOBIN: 14.6 g/dL (ref 13.0–17.0)
LYMPHS ABS: 1.3 10*3/uL (ref 0.7–4.0)
Lymphocytes Relative: 16.5 % (ref 12.0–46.0)
MCHC: 34.6 g/dL (ref 30.0–36.0)
MCV: 86.5 fl (ref 78.0–100.0)
MONO ABS: 0.9 10*3/uL (ref 0.1–1.0)
Monocytes Relative: 11.7 % (ref 3.0–12.0)
Neutro Abs: 5.7 10*3/uL (ref 1.4–7.7)
Neutrophils Relative %: 70.4 % (ref 43.0–77.0)
Platelets: 186 10*3/uL (ref 150.0–400.0)
RBC: 4.89 Mil/uL (ref 4.22–5.81)
RDW: 13.7 % (ref 11.5–15.5)
WBC: 8.1 10*3/uL (ref 4.0–10.5)

## 2015-03-30 LAB — URINALYSIS, ROUTINE W REFLEX MICROSCOPIC
Hgb urine dipstick: NEGATIVE
KETONES UR: NEGATIVE
Leukocytes, UA: NEGATIVE
Nitrite: NEGATIVE
Specific Gravity, Urine: 1.015 (ref 1.000–1.030)
URINE GLUCOSE: NEGATIVE
Urobilinogen, UA: 1 (ref 0.0–1.0)
pH: 6.5 (ref 5.0–8.0)

## 2015-03-30 LAB — BASIC METABOLIC PANEL
BUN: 10 mg/dL (ref 6–23)
CALCIUM: 9.3 mg/dL (ref 8.4–10.5)
CO2: 30 meq/L (ref 19–32)
Chloride: 101 mEq/L (ref 96–112)
Creatinine, Ser: 0.84 mg/dL (ref 0.40–1.50)
GFR: 103.14 mL/min (ref >60.00)
Glucose, Bld: 191 mg/dL — ABNORMAL HIGH (ref 70–99)
Potassium: 4 mEq/L (ref 3.5–5.1)
Sodium: 138 mEq/L (ref 135–145)

## 2015-03-30 LAB — HEPATIC FUNCTION PANEL
ALK PHOS: 116 U/L (ref 39–117)
ALT: 49 U/L (ref 0–53)
AST: 35 U/L (ref 0–37)
Albumin: 4 g/dL (ref 3.5–5.2)
BILIRUBIN TOTAL: 1.3 mg/dL — AB (ref 0.2–1.2)
Bilirubin, Direct: 0.5 mg/dL — ABNORMAL HIGH (ref 0.0–0.3)
Total Protein: 8.1 g/dL (ref 6.0–8.3)

## 2015-03-30 LAB — LIPID PANEL
CHOLESTEROL: 148 mg/dL (ref 0–200)
HDL: 43.5 mg/dL (ref >39.00)
LDL Cholesterol: 86 mg/dL (ref 0–99)
NonHDL: 104.5
Total CHOL/HDL Ratio: 3
Triglycerides: 95 mg/dL (ref 0.0–149.0)
VLDL: 19 mg/dL (ref 0.0–40.0)

## 2015-03-30 LAB — MICROALBUMIN / CREATININE URINE RATIO
Creatinine,U: 364 mg/dL
MICROALB UR: 3.4 mg/dL — AB (ref 0.0–1.9)
Microalb Creat Ratio: 0.9 mg/g (ref 0.0–30.0)

## 2015-03-30 LAB — PSA: PSA: 0.47 ng/mL (ref 0.10–4.00)

## 2015-03-30 LAB — HEMOGLOBIN A1C: Hgb A1c MFr Bld: 6.9 % — ABNORMAL HIGH (ref 4.6–6.5)

## 2015-03-30 LAB — TSH: TSH: 0.43 u[IU]/mL (ref 0.35–4.50)

## 2015-03-30 MED ORDER — DILTIAZEM HCL ER COATED BEADS 300 MG PO CP24: 300.0000 mg | ORAL_CAPSULE | Freq: Every day | ORAL | Status: DC

## 2015-03-30 MED ORDER — LOVASTATIN 40 MG PO TABS: 40.0000 mg | ORAL_TABLET | Freq: Every day | ORAL | Status: DC

## 2015-03-30 MED ORDER — OLMESARTAN MEDOXOMIL-HCTZ 40-12.5 MG PO TABS: 1.0000 | ORAL_TABLET | Freq: Every day | ORAL | Status: DC

## 2015-03-30 MED ORDER — LEVOFLOXACIN 500 MG PO TABS: 500.0000 mg | ORAL_TABLET | Freq: Every day | ORAL | Status: DC

## 2015-03-30 NOTE — Assessment & Plan Note (Signed)

## 2015-03-30 NOTE — Progress Notes (Signed)
Subjective:    Patient ID: [name], male    DOB: 1966/09/10, 49 y.o.   MRN: [id number]  HPI  Here for wellness and f/u;  Overall doing ok;  Pt denies Chest pain, worsening SOB, DOE, wheezing, orthopnea, PND, worsening LE edema, palpitations, dizziness or syncope.  Pt denies neurological change such as new headache, facial or extremity weakness.  Pt denies polydipsia, polyuria, or low sugar symptoms. Pt states overall good compliance with treatment and medications, good tolerability, and has been trying to follow appropriate diet.  Pt denies worsening depressive symptoms, suicidal ideation or panic. No fever, night sweats, wt loss, loss of appetite, or other constitutional symptoms.  Pt states good ability with ADL's, has low fall risk, home safety reviewed and adequate, no other significant changes in hearing or vision, and only occasionally active with exercise.  incidnetly with upper mid chest and bilat neck discomfort with ST x 4 days, feverish, general weakness and malaise. Past Medical History  Diagnosis Date  . HYPERCHOLESTEROLEMIA 08/06/2007  . HYPERLIPIDEMIA 08/15/2007  . GOUT 01/22/2008  . ERECTILE DYSFUNCTION 08/06/2007  . HYPERTENSION 08/15/2007  . ALLERGIC RHINITIS 01/22/2008  . GERD 01/22/2008  . EPIDIDYMITIS 02/03/2009  . SKIN LESION 09/22/2009  . NECK SPRAIN AND STRAIN 09/22/2009  . 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 S/p septal nasal repair    . S/p sinus polyp removed    . Left labrum tear surgury mar 2013    . Percutaneous liver biopsy  2005    reports that he has never smoked. He has never used smokeless tobacco. He reports that he does not drink alcohol or use illicit drugs. family history includes Colon cancer in his sister; Colon polyps in his father and sister; Crohn's disease in his sister; Diabetes in his mother; Hyperlipidemia in his mother; Hypertension in his  mother; Stroke in his father. Allergies  Allergen Reactions  . Amlodipine Besylate Other (See Comments)    unknown  . Codeine Other (See Comments)    unknown  . Penicillins Other (See Comments)    unknown   Current Outpatient Prescriptions on File Prior to Visit  Medication Sig Dispense Refill  . aspirin EC 81 MG tablet Take 81 mg by mouth daily.    Marland Kitchen diltiazem (CARDIZEM CD) 300 MG 24 hr capsule Take 1 capsule (300 mg total) by mouth daily. 90 capsule 3  . loratadine (CLARITIN) 10 MG tablet Take 10 mg by mouth daily as needed for allergies.    Marland Kitchen lovastatin (MEVACOR) 40 MG tablet TAKE 1 TABLET BY MOUTH AT BEDTIME 90 tablet 1  . Multiple Vitamin (MULTIVITAMIN WITH MINERALS) TABS tablet Take 1 tablet by mouth daily.    Marland Kitchen olmesartan-hydrochlorothiazide (BENICAR HCT) 40-12.5 MG per tablet Take 1 tablet by mouth daily. 90 tablet 3  . ursodiol (ACTIGALL) 500 MG tablet Take 500-1,000 mg by mouth 2 (two) times daily. Take 2 tablets in the am and 1 tablet in the pm    . mometasone (NASONEX) 50 MCG/ACT nasal spray Place 2 sprays into the nose daily. 17 g 11   No current facility-administered medications on file prior to visit.   Review of Systems Constitutional: Negative for increased diaphoresis, other activity, appetite or siginficant weight change other than noted HENT: Negative for worsening hearing loss, ear pain, facial swelling, mouth sores and neck stiffness.   Eyes: Negative for other worsening pain, redness or visual  disturbance.  Respiratory: Negative for shortness of breath and wheezing  Cardiovascular: Negative for chest pain and palpitations.  Gastrointestinal: Negative for diarrhea, blood in stool, abdominal distention or other pain Genitourinary: Negative for hematuria, flank pain or change in urine volume.  Musculoskeletal: Negative for myalgias or other joint complaints.  Skin: Negative for color change and wound or drainage.  Neurological: Negative for syncope and numbness.  other than noted Hematological: Negative for adenopathy. or other swelling Psychiatric/Behavioral: Negative for hallucinations, SI, self-injury, decreased concentration or other worsening agitation.      Objective:   Physical Exam BP 142/86 mmHg  Pulse 106  Temp(Src) 98.9 F (37.2 C) (Oral)  Resp 18  Ht 5\' 9"  (1.753 m)  Wt 222 lb (100.699 kg)  BMI 32.77 kg/m2  SpO2 94% VS noted, mildill Constitutional: Pt is oriented to person, place, and time. Appears well-developed and well-nourished, in no significant distress Head: Normocephalic and atraumatic.  Right Ear: External ear normal.  Left Ear: External ear normal.  Nose: Nose normal.  Mouth/Throat: Oropharynx is clear and moist.  Bilat tm's with mild erythema.  Max sinus areas non tender.  Pharynx with mild erythema, no exudate Large mild tender right > left LA Eyes: Conjunctivae and EOM are normal. Pupils are equal, round, and reactive to light.  Neck: Normal range of motion. Neck supple. No JVD present. No tracheal deviation present or significant neck LA or mass Cardiovascular: Normal rate, regular rhythm, normal heart sounds and intact distal pulses.   Pulmonary/Chest: Effort normal and breath sounds without rales or wheezing  Abdominal: Soft. Bowel sounds are normal. NT. No HSM  Musculoskeletal: Normal range of motion. Exhibits no edema.  Lymphadenopathy:  Has no cervical adenopathy.  Neurological: Pt is alert and oriented to person, place, and time. Pt has normal reflexes. No cranial nerve deficit. Motor grossly intact Skin: Skin is warm and dry. No rash noted.  Psychiatric:  Has normal mood and affect. Behavior is normal.     Assessment & Plan:

## 2015-03-30 NOTE — Assessment & Plan Note (Signed)
stable overall by history and exam, recent data reviewed with pt, and pt to continue medical treatment as before,  to f/u any worsening symptoms or concerns Lab Results  Component Value Date   HGBA1C 7.3* 03/09/2014

## 2015-03-30 NOTE — Patient Instructions (Signed)
Please take all new medication as prescribed - the antibiotic  You can also take Delsym OTC for cough, and/or Mucinex (or it's generic off brand) for congestion, and tylenol as needed for pain.  Please continue all other medications as before, and refills have been done if requested.  Please have the pharmacy call with any other refills you may need.  Please continue your efforts at being more active, low cholesterol diet, and weight control.  You are otherwise up to date with prevention measures today.  Please keep your appointments with your specialists as you may have planned  Please go to the XRAY Department in the Basement (go straight as you get off the elevator) for the x-ray testing  Please go to the LAB in the Basement (turn left off the elevator) for the tests to be done today  You will be contacted by phone if any changes need to be made immediately.  Otherwise, you will receive a letter about your results with an explanation, but please check with MyChart first.  Please remember to sign up for MyChart if you have not done so, as this will be important to you in the future with finding out test results, communicating by private email, and scheduling acute appointments online when needed.  Please return in 6 months, or sooner if needed, with Lab testing done 3-5 days before

## 2015-03-30 NOTE — Assessment & Plan Note (Signed)
Mild to mod, for antibx course,  to f/u any worsening symptoms or concerns 

## 2015-03-30 NOTE — Progress Notes (Signed)
Pre visit review using our clinic review tool, if applicable. No additional management support is needed unless otherwise documented below in the visit note. 

## 2015-03-30 NOTE — Assessment & Plan Note (Signed)
Atypical, ECG reviewed as per emr, doubt cardiac, most likely related to infetious illness today, check cxr- r/o pna

## 2015-04-06 ENCOUNTER — Encounter: Payer: Self-pay | Admitting: Internal Medicine

## 2015-04-06 ENCOUNTER — Ambulatory Visit (INDEPENDENT_AMBULATORY_CARE_PROVIDER_SITE_OTHER): Payer: BLUE CROSS/BLUE SHIELD | Admitting: Internal Medicine

## 2015-04-06 VITALS — BP 132/80 | HR 98 | Temp 99.7°F | Resp 18 | Ht 69.0 in | Wt 223.0 lb

## 2015-04-06 DIAGNOSIS — R05 Cough: Secondary | ICD-10-CM

## 2015-04-06 DIAGNOSIS — R059 Cough, unspecified: Secondary | ICD-10-CM

## 2015-04-06 DIAGNOSIS — E119 Type 2 diabetes mellitus without complications: Secondary | ICD-10-CM

## 2015-04-06 DIAGNOSIS — I10 Essential (primary) hypertension: Secondary | ICD-10-CM

## 2015-04-06 MED ORDER — HYDROCODONE-HOMATROPINE 5-1.5 MG/5ML PO SYRP: 5.0000 mL | ORAL_SOLUTION | Freq: Four times a day (QID) | ORAL | Status: DC | PRN

## 2015-04-06 MED ORDER — LEVOFLOXACIN 500 MG PO TABS: 500.0000 mg | ORAL_TABLET | Freq: Every day | ORAL | Status: DC

## 2015-04-06 NOTE — Patient Instructions (Signed)
Please take all new medication as prescribed - the antibiotic, and cough medicine  Please continue all other medications as before, and refills have been done if requested.  Please have the pharmacy call with any other refills you may need.  Please keep your appointments with your specialists as you may have planned      

## 2015-04-06 NOTE — Progress Notes (Signed)
Subjective:    Patient ID: [name], male    DOB: September 01, 1966, 49 y.o.   MRN: [id number]  HPI  Here to f/u recent prod cough, has much reduced CP, and reduced prod cough, without increased sob, doe, f/c/n/v but still has residual, more than he is comfortable with as he is leaving town in 2 days. Still with fatigue as well. Does c/o ongoing fatigue, but denies signficant daytime hypersomnolence.  Did not think he needed cough med to start, but asks this time as cough is persistent, painful at end of day, keeps him up at night. Pt denies chest pain, increased sob or doe, wheezing, orthopnea, PND, increased LE swelling, palpitations, dizziness or syncope. Pt denies new neurological symptoms such as new headache, or facial or extremity weakness or numbness  Does have several wks ongoing nasal allergy symptoms with clearish congestion, itch and sneezing, without fever, pain, ST, cough, swelling or wheezing.  Past Medical History  Diagnosis Date  . HYPERCHOLESTEROLEMIA 08/06/2007  . HYPERLIPIDEMIA 08/15/2007  . GOUT 01/22/2008  . ERECTILE DYSFUNCTION 08/06/2007  . HYPERTENSION 08/15/2007  . ALLERGIC RHINITIS 01/22/2008  . GERD 01/22/2008  . EPIDIDYMITIS 02/03/2009  . SKIN LESION 09/22/2009  . NECK SPRAIN AND STRAIN 09/22/2009  . 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 S/p septal nasal repair    . S/p sinus polyp removed    . Left labrum tear surgury mar 2013    . Percutaneous liver biopsy  2005    reports that he has never smoked. He has never used smokeless tobacco. He reports that he does not drink alcohol or use illicit drugs. family history includes Colon cancer in his sister; Colon polyps in his father and sister; Crohn's disease in his sister; Diabetes in his mother; Hyperlipidemia in his mother; Hypertension in his mother; Stroke in his father. Allergies  Allergen Reactions  . Amlodipine  Besylate Other (See Comments)    unknown  . Codeine Other (See Comments)    unknown  . Penicillins Other (See Comments)    unknown   Current Outpatient Prescriptions on File Prior to Visit  Medication Sig Dispense Refill  . aspirin EC 81 MG tablet Take 81 mg by mouth daily.    Marland Kitchen diltiazem (CARDIZEM CD) 300 MG 24 hr capsule Take 1 capsule (300 mg total) by mouth daily. 90 capsule 3  . loratadine (CLARITIN) 10 MG tablet Take 10 mg by mouth daily as needed for allergies.    Marland Kitchen lovastatin (MEVACOR) 40 MG tablet Take 1 tablet (40 mg total) by mouth at bedtime. 90 tablet 3  . Multiple Vitamin (MULTIVITAMIN WITH MINERALS) TABS tablet Take 1 tablet by mouth daily.    Marland Kitchen olmesartan-hydrochlorothiazide (BENICAR HCT) 40-12.5 MG per tablet Take 1 tablet by mouth daily. 90 tablet 3  . ursodiol (ACTIGALL) 500 MG tablet Take 500-1,000 mg by mouth 2 (two) times daily. Take 2 tablets in the am and 1 tablet in the pm    . mometasone (NASONEX) 50 MCG/ACT nasal spray Place 2 sprays into the nose daily. 17 g 11   No current facility-administered medications on file prior to visit.    Review of Systems  Constitutional: Negative for unusual diaphoresis or night sweats HENT: Negative for ringing in ear or discharge Eyes: Negative for double vision or worsening visual disturbance.  Respiratory: Negative for choking and stridor.   Gastrointestinal: Negative for vomiting or other  signifcant bowel change Genitourinary: Negative for hematuria or change in urine volume.  Musculoskeletal: Negative for other MSK pain or swelling Skin: Negative for color change and worsening wound.  Neurological: Negative for tremors and numbness other than noted  Psychiatric/Behavioral: Negative for decreased concentration or agitation other than above       Objective:   Physical Exam BP 132/80 mmHg  Pulse 98  Temp(Src) 99.7 F (37.6 C) (Oral)  Resp 18  Ht 5\' 9"  (1.753 m)  Wt 223 lb (101.152 kg)  BMI 32.92 kg/m2  SpO2  96% VS noted, mild ill Constitutional: Pt appears in no significant distress HENT: Head: NCAT.  Right Ear: External ear normal.  Left Ear: External ear normal.  Eyes: . Pupils are equal, round, and reactive to light. Conjunctivae and EOM are normal Neck: Normal range of motion. Neck supple.  Cardiovascular: Normal rate and regular rhythm.   Pulmonary/Chest: Effort normal and breath sounds without wheezing, has few RLL rales  Neurological: Pt is alert. Not confused , motor grossly intact Skin: Skin is warm. No rash, no LE edema Psychiatric: Pt behavior is normal. No agitation.     CLINICAL DATA: Upper and mid chest pain for the past 4 days associated with shortness of breath  EXAM: CHEST 2 VIEW  COMPARISON: Chest x-ray of February 20, 2004 and CT angiogram of the chest of January 13, 2007  FINDINGS: The lungs are hypoinflated. There is bibasilar atelectasis and/or scarring. The cardiac silhouette is enlarged. The pulmonary vascularity is not engorged. There is no pleural effusion. The bony thorax is unremarkable.  IMPRESSION: Bibasilar atelectasis versus scarring. The previous CT scan demonstrated bibasilar bronchiectasis which could be responsible for portion of the findings. There is no evidence of CHF.  Chest CT scanning is recommended if the patient's symptoms warrant further evaluation.   Electronically Signed  By: David Martinique M.D.  On: 03/30/2015 11:33 Assessment & Plan:

## 2015-04-08 DIAGNOSIS — R05 Cough: Secondary | ICD-10-CM | POA: Insufficient documentation

## 2015-04-08 DIAGNOSIS — R059 Cough, unspecified: Secondary | ICD-10-CM | POA: Insufficient documentation

## 2015-04-08 NOTE — Assessment & Plan Note (Signed)
Recent uRI, but ? Bronchiectasis flare as well - ok for 5 days further levaquin asd, cough med prn, pt declines CT chest for now, will re-consider for persistent cough, recurrent pna upon return from out of town

## 2015-04-08 NOTE — Assessment & Plan Note (Signed)
stable overall by history and exam, recent data reviewed with pt, and pt to continue medical treatment as before,  to f/u any worsening symptoms or concerns Lab Results  Component Value Date   HGBA1C 6.9* 03/30/2015

## 2015-04-08 NOTE — Assessment & Plan Note (Signed)
stable overall by history and exam, recent data reviewed with pt, and pt to continue medical treatment as before,  to f/u any worsening symptoms or concerns BP Readings from Last 3 Encounters:  04/06/15 132/80  03/30/15 142/86  08/11/14 140/91

## 2015-04-19 ENCOUNTER — Ambulatory Visit: Payer: Self-pay | Admitting: Internal Medicine

## 2015-04-24 ENCOUNTER — Other Ambulatory Visit: Payer: Self-pay | Admitting: Internal Medicine

## 2015-05-29 ENCOUNTER — Other Ambulatory Visit: Payer: Self-pay | Admitting: Internal Medicine

## 2016-03-29 ENCOUNTER — Other Ambulatory Visit: Payer: BLUE CROSS/BLUE SHIELD

## 2016-04-04 ENCOUNTER — Other Ambulatory Visit (INDEPENDENT_AMBULATORY_CARE_PROVIDER_SITE_OTHER): Payer: BLUE CROSS/BLUE SHIELD

## 2016-04-04 ENCOUNTER — Ambulatory Visit (INDEPENDENT_AMBULATORY_CARE_PROVIDER_SITE_OTHER): Payer: BLUE CROSS/BLUE SHIELD | Admitting: Internal Medicine

## 2016-04-04 ENCOUNTER — Telehealth: Payer: Self-pay

## 2016-04-04 ENCOUNTER — Encounter: Payer: Self-pay | Admitting: Internal Medicine

## 2016-04-04 ENCOUNTER — Other Ambulatory Visit: Payer: Self-pay | Admitting: Internal Medicine

## 2016-04-04 VITALS — BP 140/82 | HR 67 | Temp 98.3°F | Resp 20 | Wt 224.0 lb

## 2016-04-04 DIAGNOSIS — Z9109 Other allergy status, other than to drugs and biological substances: Secondary | ICD-10-CM

## 2016-04-04 DIAGNOSIS — E119 Type 2 diabetes mellitus without complications: Secondary | ICD-10-CM

## 2016-04-04 DIAGNOSIS — I10 Essential (primary) hypertension: Secondary | ICD-10-CM

## 2016-04-04 DIAGNOSIS — Z0001 Encounter for general adult medical examination with abnormal findings: Secondary | ICD-10-CM | POA: Diagnosis not present

## 2016-04-04 DIAGNOSIS — E7800 Pure hypercholesterolemia, unspecified: Secondary | ICD-10-CM

## 2016-04-04 DIAGNOSIS — J309 Allergic rhinitis, unspecified: Secondary | ICD-10-CM

## 2016-04-04 DIAGNOSIS — R6889 Other general symptoms and signs: Secondary | ICD-10-CM

## 2016-04-04 LAB — HEPATIC FUNCTION PANEL
ALBUMIN: 4.3 g/dL (ref 3.5–5.2)
ALT: 73 U/L — ABNORMAL HIGH (ref 0–53)
AST: 46 U/L — AB (ref 0–37)
Alkaline Phosphatase: 106 U/L (ref 39–117)
BILIRUBIN TOTAL: 0.9 mg/dL (ref 0.2–1.2)
Bilirubin, Direct: 0.3 mg/dL (ref 0.0–0.3)
Total Protein: 8.1 g/dL (ref 6.0–8.3)

## 2016-04-04 LAB — URINALYSIS, ROUTINE W REFLEX MICROSCOPIC
Bilirubin Urine: NEGATIVE
Hgb urine dipstick: NEGATIVE
KETONES UR: NEGATIVE
LEUKOCYTES UA: NEGATIVE
Nitrite: NEGATIVE
PH: 7 (ref 5.0–8.0)
SPECIFIC GRAVITY, URINE: 1.015 (ref 1.000–1.030)
TOTAL PROTEIN, URINE-UPE24: NEGATIVE
URINE GLUCOSE: 100 — AB
Urobilinogen, UA: 1 (ref 0.0–1.0)

## 2016-04-04 LAB — CBC WITH DIFFERENTIAL/PLATELET
Basophils Absolute: 0 10*3/uL (ref 0.0–0.1)
Basophils Relative: 0.3 % (ref 0.0–3.0)
EOS PCT: 3.3 % (ref 0.0–5.0)
Eosinophils Absolute: 0.2 10*3/uL (ref 0.0–0.7)
HEMATOCRIT: 44.3 % (ref 39.0–52.0)
HEMOGLOBIN: 15.3 g/dL (ref 13.0–17.0)
LYMPHS PCT: 25.2 % (ref 12.0–46.0)
Lymphs Abs: 1.1 10*3/uL (ref 0.7–4.0)
MCHC: 34.5 g/dL (ref 30.0–36.0)
MCV: 86.6 fl (ref 78.0–100.0)
MONO ABS: 0.9 10*3/uL (ref 0.1–1.0)
Monocytes Relative: 19.3 % — ABNORMAL HIGH (ref 3.0–12.0)
Neutro Abs: 2.4 10*3/uL (ref 1.4–7.7)
Neutrophils Relative %: 51.9 % (ref 43.0–77.0)
Platelets: 175 10*3/uL (ref 150.0–400.0)
RBC: 5.12 Mil/uL (ref 4.22–5.81)
RDW: 13.9 % (ref 11.5–15.5)
WBC: 4.6 10*3/uL (ref 4.0–10.5)

## 2016-04-04 LAB — LIPID PANEL
CHOL/HDL RATIO: 5
Cholesterol: 228 mg/dL — ABNORMAL HIGH (ref 0–200)
HDL: 48 mg/dL (ref >39.00)
LDL CALC: 149 mg/dL — AB (ref 0–99)
NONHDL: 180.11
Triglycerides: 156 mg/dL — ABNORMAL HIGH (ref 0.0–149.0)
VLDL: 31.2 mg/dL (ref 0.0–40.0)

## 2016-04-04 LAB — HEMOGLOBIN A1C: Hgb A1c MFr Bld: 6.4 % (ref 4.6–6.5)

## 2016-04-04 LAB — MICROALBUMIN / CREATININE URINE RATIO
Creatinine,U: 133.6 mg/dL
Microalb Creat Ratio: 0.7 mg/g (ref 0.0–30.0)
Microalb, Ur: 0.9 mg/dL (ref 0.0–1.9)

## 2016-04-04 LAB — BASIC METABOLIC PANEL
BUN: 10 mg/dL (ref 6–23)
CHLORIDE: 100 meq/L (ref 96–112)
CO2: 32 mEq/L (ref 19–32)
Calcium: 9.8 mg/dL (ref 8.4–10.5)
Creatinine, Ser: 0.73 mg/dL (ref 0.40–1.50)
GFR: 120.78 mL/min (ref >60.00)
GLUCOSE: 95 mg/dL (ref 70–99)
POTASSIUM: 3.7 meq/L (ref 3.5–5.1)
SODIUM: 139 meq/L (ref 135–145)

## 2016-04-04 LAB — TSH: TSH: 0.73 u[IU]/mL (ref 0.35–4.50)

## 2016-04-04 LAB — PSA: PSA: 0.5 ng/mL (ref 0.10–4.00)

## 2016-04-04 MED ORDER — OLMESARTAN MEDOXOMIL-HCTZ 40-12.5 MG PO TABS: 1.0000 | ORAL_TABLET | Freq: Every day | ORAL | Status: DC

## 2016-04-04 MED ORDER — LORATADINE 10 MG PO TABS: 10.0000 mg | ORAL_TABLET | Freq: Every day | ORAL | Status: DC | PRN

## 2016-04-04 MED ORDER — URSODIOL 500 MG PO TABS: 500.0000 mg | ORAL_TABLET | Freq: Two times a day (BID) | ORAL | Status: DC

## 2016-04-04 MED ORDER — MOMETASONE FUROATE 50 MCG/ACT NA SUSP: 2.0000 | Freq: Every day | NASAL | Status: DC

## 2016-04-04 MED ORDER — DILTIAZEM HCL ER COATED BEADS 300 MG PO CP24: 300.0000 mg | ORAL_CAPSULE | Freq: Every day | ORAL | Status: DC

## 2016-04-04 MED ORDER — ROSUVASTATIN CALCIUM 10 MG PO TABS: 10.0000 mg | ORAL_TABLET | Freq: Every day | ORAL | Status: DC

## 2016-04-04 MED ORDER — ASPIRIN EC 81 MG PO TBEC: 81.0000 mg | DELAYED_RELEASE_TABLET | Freq: Every day | ORAL | Status: DC

## 2016-04-04 NOTE — Progress Notes (Signed)
Pre visit review using our clinic review tool, if applicable. No additional management support is needed unless otherwise documented below in the visit note. 

## 2016-04-04 NOTE — Progress Notes (Signed)
Subjective:    Patient ID: [name], male    DOB: 1966-02-21, 50 y.o.   MRN: [id number]  HPI  Here for wellness and f/u;  Overall doing ok;  Pt denies Chest pain, worsening SOB, DOE, wheezing, orthopnea, PND, worsening LE edema, palpitations, dizziness or syncope.  Pt denies neurological change such as new headache, facial or extremity weakness.  Pt denies polydipsia, polyuria, or low sugar symptoms. Pt states overall good compliance with treatment and medications, good tolerability, and has been trying to follow appropriate diet.  Pt denies worsening depressive symptoms, suicidal ideation or panic. No fever, night sweats, wt loss, loss of appetite, or other constitutional symptoms.  Pt states good ability with ADL's, has low fall risk, home safety reviewed and adequate, no other significant changes in hearing or vision, and only occasionally active with exercise.  Stopped the ASA 81 mg with rectal bleeding last yr, willing to restart if ok.  Stopped statin due to shoulder and hip joint pains, now resolved off statin for several weks.  Does have several wks ongoing nasal allergy symptoms with clearish congestion, itch and sneezing, without fever, pain, ST, cough, swelling or wheezing. . Past Medical History  Diagnosis Date  . HYPERCHOLESTEROLEMIA 08/06/2007  . HYPERLIPIDEMIA 08/15/2007  . GOUT 01/22/2008  . ERECTILE DYSFUNCTION 08/06/2007  . HYPERTENSION 08/15/2007  . ALLERGIC RHINITIS 01/22/2008  . GERD 01/22/2008  . EPIDIDYMITIS 02/03/2009  . SKIN LESION 09/22/2009  . NECK SPRAIN AND STRAIN 09/22/2009  . 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 S/p septal nasal repair    . S/p sinus polyp removed    . Left labrum tear surgury mar 2013    . Percutaneous liver biopsy  2005    reports that he has never smoked. He has never used smokeless tobacco. He reports that he does not drink alcohol or  use illicit drugs. family history includes Colon cancer in his sister; Colon polyps in his father and sister; Crohn's disease in his sister; Diabetes in his mother; Hyperlipidemia in his mother; Hypertension in his mother; Stroke in his father. Allergies  Allergen Reactions  . Amlodipine Besylate Other (See Comments)    unknown  . Codeine Other (See Comments)    unknown  . Lovastatin Other (See Comments)    myalgia  . Penicillins Other (See Comments)    unknown   Current Outpatient Prescriptions on File Prior to Visit  Medication Sig Dispense Refill  . Multiple Vitamin (MULTIVITAMIN WITH MINERALS) TABS tablet Take 1 tablet by mouth daily.     No current facility-administered medications on file prior to visit.    Review of Systems Constitutional: Negative for increased diaphoresis, or other activity, appetite or siginficant weight change other than noted HENT: Negative for worsening hearing loss, ear pain, facial swelling, mouth sores and neck stiffness.   Eyes: Negative for other worsening pain, redness or visual disturbance.  Respiratory: Negative for choking or stridor Cardiovascular: Negative for other chest pain and palpitations.  Gastrointestinal: Negative for worsening diarrhea, blood in stool, or abdominal distention Genitourinary: Negative for hematuria, flank pain or change in urine volume.  Musculoskeletal: Negative for myalgias or other joint complaints.  Skin: Negative for other color change and wound or drainage.  Neurological: Negative for syncope and numbness. other than noted Hematological: Negative for adenopathy. or other swelling Psychiatric/Behavioral: Negative for hallucinations, SI, self-injury, decreased concentration or other worsening agitation.  Objective:   Physical Exam BP 140/82 mmHg  Pulse 67  Temp(Src) 98.3 F (36.8 C) (Oral)  Resp 20  Wt 224 lb (101.606 kg)  SpO2 97% VS noted,  Constitutional: Pt is oriented to person, place, and time.  Appears well-developed and well-nourished, in no significant distress Head: Normocephalic and atraumatic  Eyes: Conjunctivae and EOM are normal. Pupils are equal, round, and reactive to light Right Ear: External ear normal.  Left Ear: External ear normal Nose: Nose normal.  Mouth/Throat: Oropharynx is clear and moist  Neck: Normal range of motion. Neck supple. No JVD present. No tracheal deviation present or significant neck LA or mass Cardiovascular: Normal rate, regular rhythm, normal heart sounds and intact distal pulses.   Pulmonary/Chest: Effort normal and breath sounds without rales or wheezing  Abdominal: Soft. Bowel sounds are normal. NT. No HSM  Musculoskeletal: Normal range of motion. Exhibits no edema Lymphadenopathy: Has no cervical adenopathy.  Neurological: Pt is alert and oriented to person, place, and time. Pt has normal reflexes. No cranial nerve deficit. Motor grossly intact Skin: Skin is warm and dry. No rash noted or new ulcers Psychiatric:  Has normal mood and affect. Behavior is normal.     Assessment & Plan:

## 2016-04-04 NOTE — Telephone Encounter (Signed)
Medications sent to pharmacy

## 2016-04-04 NOTE — Patient Instructions (Signed)
OK to re-start the Aspirin 81 mg per day  We will need to consider starting crestor, depending on the lab results today  Please continue all other medications as before, and refills have been done if requested., including the ursodiol and allergy meds  Please have the pharmacy call with any other refills you may need.  Please continue your efforts at being more active, low cholesterol diet, and weight control.  You are otherwise up to date with prevention measures today.  Please keep your appointments with your specialists as you may have planned  Please go to the LAB in the Basement (turn left off the elevator) for the tests to be done today  You will be contacted by phone if any changes need to be made immediately.  Otherwise, you will receive a letter about your results with an explanation, but please check with MyChart first.  Please remember to sign up for MyChart if you have not done so, as this will be important to you in the future with finding out test results, communicating by private email, and scheduling acute appointments online when needed.  Please return in 6 months, or sooner if needed, with Lab testing done 3-5 days before

## 2016-04-06 ENCOUNTER — Other Ambulatory Visit: Payer: Self-pay | Admitting: Internal Medicine

## 2016-04-07 NOTE — Assessment & Plan Note (Addendum)
Mild to mod seasonal flare, for allegra otc prn,  to f/u any worsening symptoms or concerns  In addition to the time spent performing CPE, I spent an additional 40 minutes face to face,in which greater than 50% of this time was spent in counseling and coordination of care for patient's acute illness as documented.

## 2016-04-07 NOTE — Assessment & Plan Note (Signed)
stable overall by history and exam, recent data reviewed with pt, and pt to continue medical treatment as before,  to f/u any worsening symptoms or concerns BP Readings from Last 3 Encounters:  04/04/16 140/82  04/06/15 132/80  03/30/15 142/86

## 2016-04-07 NOTE — Assessment & Plan Note (Signed)
stable overall by history and exam, recent data reviewed with pt, and pt to continue off statinn for now, to f/u any worsening symptoms or concerns, to consider change to new statin trial, cont low chol diet

## 2016-04-07 NOTE — Assessment & Plan Note (Signed)
stable overall by history and exam, recent data reviewed with pt, and pt to continue medical treatment as before,  to f/u any worsening symptoms or concerns Lab Results  Component Value Date   HGBA1C 6.4 04/04/2016

## 2016-04-07 NOTE — Assessment & Plan Note (Signed)

## 2016-04-10 ENCOUNTER — Telehealth: Payer: Self-pay

## 2016-04-10 NOTE — Telephone Encounter (Signed)
rosuvastatin (CRESTOR) 10 MG tablet [id number]  Patient states that this medication is too strong he wanted to know if he could just take half a pill. Please follow up.

## 2016-04-10 NOTE — Telephone Encounter (Signed)
Patient aware.

## 2016-04-10 NOTE — Telephone Encounter (Signed)
Not sure what he means by "too strong" but taking half may help somewhat, but also taking qod can be an option if he is having side effects such as myalgias to the large joints

## 2016-04-10 NOTE — Telephone Encounter (Signed)
Please advise 

## 2017-04-09 ENCOUNTER — Other Ambulatory Visit: Payer: Self-pay | Admitting: Internal Medicine

## 2017-04-13 ENCOUNTER — Other Ambulatory Visit: Payer: Self-pay | Admitting: Internal Medicine

## 2017-05-06 ENCOUNTER — Ambulatory Visit: Payer: Self-pay | Admitting: Internal Medicine

## 2017-05-07 ENCOUNTER — Encounter: Payer: Self-pay | Admitting: Internal Medicine

## 2017-05-07 ENCOUNTER — Ambulatory Visit (INDEPENDENT_AMBULATORY_CARE_PROVIDER_SITE_OTHER)
Admission: RE | Admit: 2017-05-07 | Discharge: 2017-05-07 | Disposition: A | Discharge status: Home / Self Care | Payer: BLUE CROSS/BLUE SHIELD | Source: Ambulatory Visit | Attending: Internal Medicine | Admitting: Internal Medicine

## 2017-05-07 ENCOUNTER — Other Ambulatory Visit: Payer: Self-pay | Admitting: Internal Medicine

## 2017-05-07 ENCOUNTER — Other Ambulatory Visit (INDEPENDENT_AMBULATORY_CARE_PROVIDER_SITE_OTHER): Payer: BLUE CROSS/BLUE SHIELD

## 2017-05-07 ENCOUNTER — Ambulatory Visit (INDEPENDENT_AMBULATORY_CARE_PROVIDER_SITE_OTHER): Payer: BLUE CROSS/BLUE SHIELD | Admitting: Internal Medicine

## 2017-05-07 VITALS — BP 142/90 | HR 65 | Ht 69.0 in | Wt 233.0 lb

## 2017-05-07 DIAGNOSIS — K743 Primary biliary cirrhosis: Secondary | ICD-10-CM

## 2017-05-07 DIAGNOSIS — Z0001 Encounter for general adult medical examination with abnormal findings: Secondary | ICD-10-CM

## 2017-05-07 DIAGNOSIS — L989 Disorder of the skin and subcutaneous tissue, unspecified: Secondary | ICD-10-CM | POA: Insufficient documentation

## 2017-05-07 DIAGNOSIS — M546 Pain in thoracic spine: Secondary | ICD-10-CM

## 2017-05-07 DIAGNOSIS — E119 Type 2 diabetes mellitus without complications: Secondary | ICD-10-CM

## 2017-05-07 DIAGNOSIS — R202 Paresthesia of skin: Secondary | ICD-10-CM

## 2017-05-07 DIAGNOSIS — E7800 Pure hypercholesterolemia, unspecified: Secondary | ICD-10-CM

## 2017-05-07 DIAGNOSIS — R7989 Other specified abnormal findings of blood chemistry: Secondary | ICD-10-CM

## 2017-05-07 DIAGNOSIS — Z114 Encounter for screening for human immunodeficiency virus [HIV]: Secondary | ICD-10-CM

## 2017-05-07 DIAGNOSIS — Z9109 Other allergy status, other than to drugs and biological substances: Secondary | ICD-10-CM

## 2017-05-07 DIAGNOSIS — I10 Essential (primary) hypertension: Secondary | ICD-10-CM

## 2017-05-07 LAB — BASIC METABOLIC PANEL
BUN: 8 mg/dL (ref 6–23)
CALCIUM: 9.4 mg/dL (ref 8.4–10.5)
CHLORIDE: 100 meq/L (ref 96–112)
CO2: 31 mEq/L (ref 19–32)
CREATININE: 0.79 mg/dL (ref 0.40–1.50)
GFR: 132.83 mL/min (ref >60.00)
Glucose, Bld: 146 mg/dL — ABNORMAL HIGH (ref 70–99)
Potassium: 3.6 mEq/L (ref 3.5–5.1)
SODIUM: 138 meq/L (ref 135–145)

## 2017-05-07 LAB — URINALYSIS, ROUTINE W REFLEX MICROSCOPIC
BILIRUBIN URINE: NEGATIVE
HGB URINE DIPSTICK: NEGATIVE
Ketones, ur: NEGATIVE
LEUKOCYTES UA: NEGATIVE
NITRITE: NEGATIVE
PH: 6 (ref 5.0–8.0)
Specific Gravity, Urine: 1.015 (ref 1.000–1.030)
Total Protein, Urine: NEGATIVE
URINE GLUCOSE: NEGATIVE
Urobilinogen, UA: 0.2 (ref 0.0–1.0)

## 2017-05-07 LAB — CBC WITH DIFFERENTIAL/PLATELET
BASOS ABS: 0 10*3/uL (ref 0.0–0.1)
Basophils Relative: 0.8 % (ref 0.0–3.0)
EOS ABS: 0.1 10*3/uL (ref 0.0–0.7)
Eosinophils Relative: 2.6 % (ref 0.0–5.0)
HCT: 43 % (ref 39.0–52.0)
HEMOGLOBIN: 14.8 g/dL (ref 13.0–17.0)
Lymphocytes Relative: 29.4 % (ref 12.0–46.0)
Lymphs Abs: 1.3 10*3/uL (ref 0.7–4.0)
MCHC: 34.5 g/dL (ref 30.0–36.0)
MCV: 87.2 fl (ref 78.0–100.0)
Monocytes Absolute: 0.6 10*3/uL (ref 0.1–1.0)
Monocytes Relative: 13.3 % — ABNORMAL HIGH (ref 3.0–12.0)
NEUTROS PCT: 53.9 % (ref 43.0–77.0)
Neutro Abs: 2.4 10*3/uL (ref 1.4–7.7)
Platelets: 154 10*3/uL (ref 150.0–400.0)
RBC: 4.93 Mil/uL (ref 4.22–5.81)
RDW: 13.7 % (ref 11.5–15.5)
WBC: 4.4 10*3/uL (ref 4.0–10.5)

## 2017-05-07 LAB — HEPATIC FUNCTION PANEL
ALBUMIN: 4.4 g/dL (ref 3.5–5.2)
ALT: 66 U/L — AB (ref 0–53)
AST: 38 U/L — AB (ref 0–37)
Alkaline Phosphatase: 124 U/L — ABNORMAL HIGH (ref 39–117)
Bilirubin, Direct: 0.2 mg/dL (ref 0.0–0.3)
TOTAL PROTEIN: 8.1 g/dL (ref 6.0–8.3)
Total Bilirubin: 0.7 mg/dL (ref 0.2–1.2)

## 2017-05-07 LAB — LDL CHOLESTEROL, DIRECT: Direct LDL: 143 mg/dL

## 2017-05-07 LAB — LIPID PANEL
CHOL/HDL RATIO: 6
Cholesterol: 243 mg/dL — ABNORMAL HIGH (ref 0–200)
HDL: 43.6 mg/dL (ref >39.00)
NONHDL: 199.28
TRIGLYCERIDES: 225 mg/dL — AB (ref 0.0–149.0)
VLDL: 45 mg/dL — ABNORMAL HIGH (ref 0.0–40.0)

## 2017-05-07 LAB — TSH: TSH: 1 u[IU]/mL (ref 0.35–4.50)

## 2017-05-07 LAB — MICROALBUMIN / CREATININE URINE RATIO
CREATININE, U: 104.4 mg/dL
MICROALB/CREAT RATIO: 1.2 mg/g (ref 0.0–30.0)
Microalb, Ur: 1.3 mg/dL (ref 0.0–1.9)

## 2017-05-07 LAB — PSA: PSA: 0.56 ng/mL (ref 0.10–4.00)

## 2017-05-07 LAB — HEMOGLOBIN A1C: Hgb A1c MFr Bld: 8.3 % — ABNORMAL HIGH (ref 4.6–6.5)

## 2017-05-07 MED ORDER — URSODIOL 500 MG PO TABS: 500.0000 mg | ORAL_TABLET | Freq: Two times a day (BID) | ORAL | 3 refills | Status: DC

## 2017-05-07 MED ORDER — FEXOFENADINE HCL 180 MG PO TABS: 180.0000 mg | ORAL_TABLET | Freq: Every day | ORAL | 3 refills | Status: DC

## 2017-05-07 MED ORDER — DILTIAZEM HCL ER COATED BEADS 300 MG PO CP24: 300.0000 mg | ORAL_CAPSULE | Freq: Every day | ORAL | 3 refills | Status: DC

## 2017-05-07 MED ORDER — MOMETASONE FUROATE 50 MCG/ACT NA SUSP: 2.0000 | Freq: Every day | NASAL | 11 refills | Status: DC

## 2017-05-07 MED ORDER — OLMESARTAN MEDOXOMIL-HCTZ 40-12.5 MG PO TABS: 1.0000 | ORAL_TABLET | Freq: Every day | ORAL | 3 refills | Status: DC

## 2017-05-07 MED ORDER — EZETIMIBE 10 MG PO TABS: 10.0000 mg | ORAL_TABLET | Freq: Every day | ORAL | 3 refills | Status: DC

## 2017-05-07 NOTE — Addendum Note (Signed)
Addended by: Biagio Borg on: 05/07/2017 10:41 AM   Modules accepted: Orders

## 2017-05-07 NOTE — Assessment & Plan Note (Signed)
Exam benign today but symptoms recurrent, ? Related to t-spine issue, for neurology referral as above

## 2017-05-07 NOTE — Assessment & Plan Note (Signed)
ruged pt to restart the ursodioll, f/u Liver Gi as planned

## 2017-05-07 NOTE — Progress Notes (Signed)
Subjective:    Patient ID: [name], male    DOB: [birth year], 51 y.o.   MRN: [id number]  HPI   Here for wellness and f/u;  Overall doing ok;  Pt denies Chest pain, worsening SOB, DOE, wheezing, orthopnea, PND, worsening LE edema, palpitations, dizziness or syncope.  Pt denies neurological change such as new headache, facial or extremity weakness.  Pt denies polydipsia, polyuria, or low sugar symptoms. Pt states overall good compliance with treatment and medications, good tolerability, and has been trying to follow appropriate diet.  Pt denies worsening depressive symptoms, suicidal ideation or panic. No fever, night sweats, wt loss, loss of appetite, or other constitutional symptoms.  Pt states good ability with ADL's, has low fall risk, home safety reviewed and adequate, no other significant changes in hearing or vision, and occasionally active with exercise, just not in last few wks due to current pain. Pt states had joint pain with crestor so not taking. Also not taking the ursodiall but willing to restart that one. 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 Wt Readings from Last 3 Encounters:  05/07/17 233 lb (105.7 kg)  04/04/16 224 lb (101.6 kg)  04/06/15 223 lb (101.2 kg)   BP Readings from Last 3 Encounters:  05/07/17 (!) 142/90  04/04/16 140/82  04/06/15 132/80   Not depressed or overly stressed  But hard to sleep due to back pain, forced to sleep in recliner b/c cant lie flat in bed;  Located mid thoracic just below scapula level in the midline, started x 2 wks, first noticed with sitting on an airplane, worse during business meetings and persistent, overall mild with occasional worsening, constant, lying flat makes worse and arching the back as well.  No problem with bending forward at the waist. No radiation.  Wondered at the outset if was a renal stone but does not think so now.  Denies urinary symptoms such as dysuria, frequency, urgency, flank pain, hematuria or n/v,  fever, chills.  Has had incrsaed volume urine recently due to incresaed fluids po in case it was a kidney stone.  No prior hx of spine issue, no surgury, no MRI in past.  Does have some mild intermittent numbness to bilat entire legs to standing up from sitting, and then walking, but then after 40 yrds seems to improve and resolved. Also Has a ? Wart to the third finger left hen just distal to the PIP posteriorly.  Wants to have it off.  Past Medical History:  Diagnosis Date  . ALLERGIC RHINITIS 01/22/2008  . EPIDIDYMITIS 02/03/2009  . ERECTILE DYSFUNCTION 08/06/2007  . GERD 01/22/2008  . GOUT 01/22/2008  . 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 HYPERCHOLESTEROLEMIA 08/06/2007  . HYPERLIPIDEMIA 08/15/2007  . HYPERTENSION 08/15/2007  . NECK SPRAIN AND STRAIN 09/22/2009  . 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 SKIN LESION 09/22/2009   Past Surgical History:  Procedure Laterality Date  . left labrum tear surgury mar 2013    . PERCUTANEOUS LIVER BIOPSY  2005  . s/p septal nasal repair    . s/p sinus polyp removed      reports that he has never smoked. He has never used smokeless tobacco. He reports that he does not drink alcohol or use drugs. family history includes Colon cancer in his sister; Colon polyps in his father and sister; Crohn's disease in his sister; Diabetes in his mother; Hyperlipidemia in his mother; Hypertension in his mother; Stroke in his father. Allergies  Allergen Reactions  . Amlodipine Besylate Other (See Comments)    unknown  . Codeine Other (See Comments)    unknown  . Lovastatin Other (See Comments)    myalgia  . Penicillins Other (See Comments)    unknown   Review of Systems /Constitutional: Negative for other unusual diaphoresis, sweats, appetite or weight changes HENT: Negative for other worsening hearing loss, ear pain, facial swelling, mouth sores or neck stiffness.   Eyes: Negative for other worsening pain, redness or other visual disturbance.    Respiratory: Negative for other stridor or swelling Cardiovascular: Negative for other palpitations or other chest pain  Gastrointestinal: Negative for worsening diarrhea or loose stools, blood in stool, distention or other pain Genitourinary: Negative for hematuria, flank pain or other change in urine volume.  Musculoskeletal: Negative for myalgias or other joint swelling.  Skin: Negative for other color change, or other wound or worsening drainage.  Neurological: Negative for other syncope or numbness. Hematological: Negative for other adenopathy or swelling Psychiatric/Behavioral: Negative for hallucinations, other worsening agitation, SI, self-injury, or new decreased concentration All other system neg per pt    Objective:   Physical Exam BP (!) 142/90   Pulse 65   Ht 5\' 9"  (1.753 m)   Wt 233 lb (105.7 kg)   SpO2 99%   BMI 34.41 kg/m  VS noted,  Constitutional: Pt is oriented to person, place, and time. Appears well-developed and well-nourished, in no significant distress and comfortable Head: Normocephalic and atraumatic  Eyes: Conjunctivae and EOM are normal. Pupils are equal, round, and reactive to light Right Ear: External ear normal without discharge Left Ear: External ear normal without discharge Nose: Nose without discharge or deformity Mouth/Throat: Oropharynx is without other ulcerations and moist  Neck: Normal range of motion. Neck supple. No JVD present. No tracheal deviation present or significant neck LA or mass Cardiovascular: Normal rate, regular rhythm, normal heart sounds and intact distal pulses.   Pulmonary/Chest: WOB normal and breath sounds without rales or wheezing  Abdominal: Soft. Bowel sounds are normal. NT. No HSM  Musculoskeletal: Normal range of motion. Exhibits no edema; does have localized tender with ? Bony swelling in midline T spine about t-5-6 I estimate, without rash or no  Lymphadenopathy: Has no other cervical adenopathy.  Neurological: Pt  is alert and oriented to person, place, and time. Pt has normal reflexes. No cranial nerve deficit. Motor 5/5 intact, Gait intact, sens intact to LT Skin: Skin is warm and dry. No rash noted or new ulcerations, but has approx 10 mm warty lesion to mid post third finger left hand just distal to the PIP Psychiatric:  Has normal mood and affect. Behavior is normal without agitation No other exam findings  Lab Results  Component Value Date   WBC 4.6 04/04/2016   HGB 15.3 04/04/2016   HCT 44.3 04/04/2016   PLT 175.0 04/04/2016   GLUCOSE 95 04/04/2016   CHOL 228 (H) 04/04/2016   TRIG 156.0 (H) 04/04/2016   HDL 48.00 04/04/2016   LDLDIRECT 81.9 03/28/2011   LDLCALC 149 (H) 04/04/2016   ALT 73 (H) 04/04/2016   AST 46 (H) 04/04/2016   NA 139 04/04/2016   K 3.7 04/04/2016   CL 100 04/04/2016   CREATININE 0.73 04/04/2016   BUN 10 04/04/2016   CO2 32 04/04/2016   TSH 0.73 04/04/2016   PSA 0.50 04/04/2016   INR 1.08 08/26/2014   HGBA1C 6.4 04/04/2016   MICROALBUR 0.9 04/04/2016  Assessment & Plan:

## 2017-05-07 NOTE — Assessment & Plan Note (Signed)
stable overall by history and exam, recent data reviewed with pt, and pt to continue medical treatment as before,  to f/u any worsening symptoms or concerns BP Readings from Last 3 Encounters:  05/07/17 (!) 142/90  04/04/16 140/82  04/06/15 132/80

## 2017-05-07 NOTE — Assessment & Plan Note (Signed)
Ok to start zetia 10 mg daily as is statin intolerant, for lower chol diet

## 2017-05-07 NOTE — Patient Instructions (Signed)
Please take all new medication as prescribed - the zetia for cholesterol  Please continue all other medications as before, and refills have been done if requested.  Please have the pharmacy call with any other refills you may need.  Please continue your efforts at being more active, low cholesterol diet, and weight control.  You are otherwise up to date with prevention measures today.  Please keep your appointments with your specialists as you may have planned  You will be contacted regarding the referral for: MRI for the T-spine, and Neurology, and Dermatology  Please go to the XRAY Department in the Basement (go straight as you get off the elevator) for the x-ray testing  Please go to the LAB in the Basement (turn left off the elevator) for the tests to be done today  You will be contacted by phone if any changes need to be made immediately.  Otherwise, you will receive a letter about your results with an explanation, but please check with MyChart first.  Please remember to sign up for MyChart if you have not done so, as this will be important to you in the future with finding out test results, communicating by private email, and scheduling acute appointments online when needed.  Please return in 6 months, or sooner if needed, with Lab testing done 3-5 days before

## 2017-05-07 NOTE — Assessment & Plan Note (Signed)

## 2017-05-07 NOTE — Assessment & Plan Note (Signed)
?   Control with 10 lb wt gain  - for a1c today, cont diet, excericse, wt loss, and f/u a1c in 6 mo as well

## 2017-05-07 NOTE — Assessment & Plan Note (Addendum)
Unusual of ? Clinical significance except is markedly lifestyle impairing with sleep due to pain; has abnormal exam exam with tender area as documented, and I suspect his underlying leg pareshtesias may be related;  In this case does not seem likely for infection or tumor but can be completely r/o, or other such as syrinx; also unusual T-spine DJD/DDD cannot be ruled out as well; for t-spine films today, MRI T-spine and refer neurology  In addition to the time spent performing CPE, I spent an additional 25 minutes face to face,in which greater than 50% of this time was spent in counseling and coordination of care for patient's acute illness as documented, including the differential diagnosis, and options for evalaution and management of his t-spine pain, leg numbness, skin lesion ? Wart, and well as d/w pt regarding strict attention to DM diet, exercise, wt loss/control , checking CBG's and regular f/u, as well as med compliance importance with respect to the [facility]

## 2017-05-07 NOTE — Assessment & Plan Note (Signed)
C/w warty lesion, for derm referral

## 2017-06-02 ENCOUNTER — Ambulatory Visit
Admission: RE | Admit: 2017-06-02 | Discharge: 2017-06-02 | Disposition: A | Discharge status: Home / Self Care | Payer: BLUE CROSS/BLUE SHIELD | Source: Ambulatory Visit | Attending: Internal Medicine | Admitting: Internal Medicine

## 2017-06-02 ENCOUNTER — Other Ambulatory Visit: Payer: Self-pay | Admitting: Internal Medicine

## 2017-06-02 DIAGNOSIS — M4804 Spinal stenosis, thoracic region: Secondary | ICD-10-CM

## 2017-06-02 DIAGNOSIS — M546 Pain in thoracic spine: Secondary | ICD-10-CM

## 2017-06-03 ENCOUNTER — Telehealth: Payer: Self-pay

## 2017-06-03 NOTE — Telephone Encounter (Signed)
Spoke to pt and gave him [facility] # to call.

## 2017-06-03 NOTE — Telephone Encounter (Signed)
Patient has called back.  Patient is requesting more information in regard to pinched spinal cord.  He would also like to know if he has to do another MRI or can there be something else that can be done?  Patient does not like MRI.

## 2017-06-03 NOTE — Telephone Encounter (Signed)
Called pt, LVM.   

## 2017-06-03 NOTE — Telephone Encounter (Signed)
Not sure what to say except pressure on the spinal cord can lead to spinal cord damage if persists; the second MRI is to make sure there is no mass or other problem that is doing the pressure  Please make sure to keep appt with Neurology as planned

## 2017-06-03 NOTE — Telephone Encounter (Signed)
Please advise 

## 2017-06-03 NOTE — Telephone Encounter (Signed)
Spoke with pt. He expressed understanding. He would like to go ahead and schedule the 2nd MRI however he did mention that he would be traveling and would like some alternative dates in case. Please call pt.

## 2017-06-03 NOTE — Telephone Encounter (Signed)
-----   Message from Biagio Borg, MD sent at 06/02/2017  4:08 PM EDT ----- Left message on MyChart, pt to cont same tx except  The test results show that your current treatment is OK, except there is evidence for spinal cord "pinching" at one level.  We need to repeat the MRI but this time with contrast to better see the spinal cord.  You should be called from the office about this soon.[name] to please inform pt, I will do order

## 2017-06-27 ENCOUNTER — Ambulatory Visit
Admission: RE | Admit: 2017-06-27 | Discharge: 2017-06-27 | Disposition: A | Discharge status: Home / Self Care | Payer: BLUE CROSS/BLUE SHIELD | Source: Ambulatory Visit | Attending: Internal Medicine | Admitting: Internal Medicine

## 2017-06-27 DIAGNOSIS — M4804 Spinal stenosis, thoracic region: Secondary | ICD-10-CM

## 2017-06-27 MED ORDER — GADOBENATE DIMEGLUMINE 529 MG/ML IV SOLN: 20.0000 mL | Freq: Once | INTRAVENOUS | Status: DC | PRN

## 2017-07-03 ENCOUNTER — Encounter: Payer: Self-pay | Admitting: Neurology

## 2017-07-03 ENCOUNTER — Ambulatory Visit (INDEPENDENT_AMBULATORY_CARE_PROVIDER_SITE_OTHER): Payer: BLUE CROSS/BLUE SHIELD | Admitting: Neurology

## 2017-07-03 VITALS — BP 166/89 | HR 74 | Ht 69.0 in | Wt 230.0 lb

## 2017-07-03 DIAGNOSIS — M542 Cervicalgia: Secondary | ICD-10-CM | POA: Diagnosis not present

## 2017-07-03 DIAGNOSIS — K743 Primary biliary cirrhosis: Secondary | ICD-10-CM

## 2017-07-03 DIAGNOSIS — M545 Low back pain, unspecified: Secondary | ICD-10-CM | POA: Insufficient documentation

## 2017-07-03 NOTE — Progress Notes (Signed)
PATIENT: [name] DOB: [birth year]  Chief Complaint  Patient presents with  . Neck/Thoracic Pain    He is here to have Troy neck and thoracic pain further evaluated.  He would like to review Troy recent thoracic MRI.  Troy pain worsens with walking. Reports numbness in Troy bilateral legs after prolonged sitting.    Marland Kitchen PCP    Biagio Borg, MD     Garvin a 51 years old right-handed male, seen in refer by  [name], [name] for evaluation of back pain, initial evaluation was on July 03 2017  He had a history of hypertension, hyperlipidemia, primary biliary cirrhosis since 2005,he presented with abnormal liver functional test, the diagnosis was confirmed by abnormal liver biopsy  I was able to review liver biopsy report on August 27 2014 from [facility], chronic hepatitis with portal granulomas, increased numbers of plasma cells, and the patch eating for site mediated ductal injury, consistent with primary biliary cirrhosis/autoimmune hepatitis overlap,  He was taking ursodiol 500mg  from 2015-2018, now has stopped the medication due to multiple joints pain,  He complains of chronic upper back, shoulder pain, multiple joints pain, especially after prolonged airplane ride, when he get up take the first few steps, he felt bilateral lower extremity numb, stiffness, but denies persistent sensory loss, no weakness, no gait abnormality, prolonged walking will trigger more back pain, he denies bowel and bladder incontinence.  He also complains of worsening neck pain since 2017, radiating pain to right shoulder,  I was able to review laboratory evaluations, LDL was mildly elevated 143, cholesterol 243, triglyceride 225, normal PSA 0.56, TSH, CBC with elevated monocytes, abnormal liver functional task, elevated alkaline phosphate, mildly elevated AST, ALT, mild elevated glucose 146, A1c 8.3,  In 2015, negative hepatitis B core  antibody,  hepatitis C, B surface antibody  REVIEW OF SYSTEMS: Full 14 system review of systems performed and notable only for regarding still, joint pain, joint swelling, numbness  ALLERGIES: Allergies  Allergen Reactions  . Amlodipine Besylate Other (See Comments)    unknown  . Codeine Other (See Comments)    unknown  . Crestor [Rosuvastatin Calcium] Other (See Comments)    myalgia  . Lovastatin Other (See Comments)    myalgia  . Penicillins Other (See Comments)    unknown    HOME MEDICATIONS: Current Outpatient Prescriptions  Medication Sig Dispense Refill  . aspirin EC 81 MG tablet Take 1 tablet (81 mg total) by mouth daily. 90 tablet 11  . diltiazem (CARDIZEM CD) 300 MG 24 hr capsule Take 1 capsule (300 mg total) by mouth daily. 90 capsule 3  . ezetimibe (ZETIA) 10 MG tablet Take 1 tablet (10 mg total) by mouth daily. 90 tablet 3  . fexofenadine (ALLEGRA) 180 MG tablet Take 1 tablet (180 mg total) by mouth daily. 90 tablet 3  . mometasone (NASONEX) 50 MCG/ACT nasal spray Place 2 sprays into the nose daily. 17 g 11  . Multiple Vitamin (MULTIVITAMIN WITH MINERALS) TABS tablet Take 1 tablet by mouth daily.    Marland Kitchen olmesartan-hydrochlorothiazide (BENICAR HCT) 40-12.5 MG tablet Take 1 tablet by mouth daily. 90 tablet 3  . ursodiol (ACTIGALL) 500 MG tablet Take 1-2 tablets (500-1,000 mg total) by mouth 2 (two) times daily. Take 2 tablets in the am and 1 tablet in the pm 270 tablet 3   No current facility-administered medications for this visit.     PAST MEDICAL HISTORY: Past  Medical History:  Diagnosis Date  . ALLERGIC RHINITIS 01/22/2008  . EPIDIDYMITIS 02/03/2009  . ERECTILE DYSFUNCTION 08/06/2007  . GERD 01/22/2008  . GOUT 01/22/2008  . 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 HYPERCHOLESTEROLEMIA 08/06/2007  . HYPERLIPIDEMIA 08/15/2007  . HYPERTENSION 08/15/2007  . NECK SPRAIN AND STRAIN 09/22/2009  . Primary biliary cirrhosis ([place]) 04/05/2011  . SKIN LESION 09/22/2009  . Thoracic back  pain     PAST SURGICAL HISTORY: Past Surgical History:  Procedure Laterality Date  . left labrum tear surgury mar 2013    . PERCUTANEOUS LIVER BIOPSY  2005  . s/p septal nasal repair    . s/p sinus polyp removed      FAMILY HISTORY: Family History  Problem Relation Age of Onset  . Diabetes Mother   . Hypertension Mother   . Hyperlipidemia Mother   . Heart failure Mother   . Stroke Father   . Colon polyps Father   . Colon cancer Sister   . Colon polyps Sister   . Crohn's disease Sister   . Heart disease Maternal Grandmother   . Cancer Maternal Grandfather        unsure of type  . Cancer Paternal Grandmother        unsure of type  . Stroke Paternal Grandmother     SOCIAL HISTORY:  Social History   Social History  . Marital status: Married    Spouse name: N/A  . Number of children: 3  . Years of education: College   Occupational History  . VP of sales    Social History Main Topics  . Smoking status: Never Smoker  . Smokeless tobacco: Never Used  . Alcohol use Yes     Comment: Occasionally  . Drug use: No  . Sexual activity: Not on file   Other Topics Concern  . 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   Right-handed.        PHYSICAL EXAM   Vitals:   07/03/17 0823  BP: (!) 166/89  Pulse: 74  Weight: 230 lb (104.3 kg)  Height: 5\' 9"  (1.753 m)    Not recorded      Body mass index is 33.97 kg/m.  PHYSICAL EXAMNIATION:  Gen: NAD, conversant, well nourised, obese, well groomed                     Cardiovascular: Regular rate rhythm, no peripheral edema, warm, nontender. Eyes: Conjunctivae clear without exudates or hemorrhage Neck: Supple, no carotid bruits. Pulmonary: Clear to auscultation bilaterally   NEUROLOGICAL EXAM:  MENTAL STATUS: Speech:    Speech is normal; fluent and spontaneous with normal comprehension.  Cognition:      Orientation to time, place and person     Normal recent and remote memory     Normal Attention span and concentration     Normal Language, naming, repeating,spontaneous speech     Fund of knowledge   CRANIAL NERVES: CN II: Visual fields are full to confrontation. Fundoscopic exam is normal with sharp discs and no vascular changes. Pupils are round equal and briskly reactive to light. CN III, IV, VI: extraocular movement are normal. No ptosis. CN V: Facial sensation is intact to pinprick in all 3 divisions bilaterally. Corneal responses are intact.  CN VII: Face is symmetric with normal eye closure and smile. CN VIII:  Hearing is normal to rubbing fingers CN IX, X: Palate elevates symmetrically. Phonation is normal. CN XI: Head turning and shoulder shrug are intact CN XII: Tongue is midline with normal movements and no atrophy.  MOTOR: There is no pronator drift of out-stretched arms. Muscle bulk and tone are normal. Muscle strength is normal.  REFLEXES: Reflexes are 2+ and symmetric at the biceps, triceps, knees, and ankles. Plantar responses are flexor.  SENSORY: Intact to light touch, pinprick, positional sensation and vibratory sensation are intact in fingers and toes.  COORDINATION: Rapid alternating movements and fine finger movements are intact. There is no dysmetria on finger-to-nose and heel-knee-shin.    GAIT/STANCE: Posture is normal. Gait is steady with normal steps, base, arm swing, and turning. Heel and toe walking are normal. Tandem gait is normal.  Romberg is absent.   DIAGNOSTIC DATA (LABS, IMAGING, TESTING) - I reviewed patient records, labs, notes, testing and imaging myself where available.   ASSESSMENT AND PLAN  [name] is a 51 y.o. male    Chronic neck pain, radiating pain to right shoulder  MRI of cervical spine to rule out cervical radiculopathy  Back pain  MRI of thoracic spine showed multilevel degenerative disc disease, most severe at  T10-11, evidence of myelomalacia  Laboratory evaluations to rule out inflammatory process  [name], M.D. Ph.D.  [facility] [address], [place],  [address] Ph: [phone] Fax: [phone]  CC: Biagio Borg, MD

## 2017-07-04 LAB — RHEUMATOID FACTOR: Rhuematoid fact SerPl-aCnc: <10 IU/mL (ref 0.0–13.9)

## 2017-07-05 LAB — TSH: TSH: 0.991 u[IU]/mL (ref 0.450–4.500)

## 2017-07-05 LAB — CK: Total CK: 191 U/L (ref 24–204)

## 2017-07-05 LAB — ANA W/REFLEX IF POSITIVE: ANA: NEGATIVE

## 2017-07-05 LAB — VITAMIN D 25 HYDROXY (VIT D DEFICIENCY, FRACTURES): VIT D 25 HYDROXY: 19 ng/mL — AB (ref 30.0–100.0)

## 2017-07-05 LAB — C-REACTIVE PROTEIN: CRP: 8.7 mg/L — ABNORMAL HIGH (ref 0.0–4.9)

## 2017-07-05 LAB — SYPHILIS: RPR W/REFLEX TO RPR TITER AND TREPONEMAL ANTIBODIES, TRADITIONAL SCREENING AND DIAGNOSIS ALGORITHM: RPR Ser Ql: NONREACTIVE

## 2017-07-05 LAB — COPPER, SERUM: Copper: 107 ug/dL (ref 72–166)

## 2017-07-05 LAB — FOLATE: Folate: >20 ng/mL

## 2017-07-05 LAB — VITAMIN B12: Vitamin B-12: 737 pg/mL (ref 232–1245)

## 2017-07-05 LAB — SEDIMENTATION RATE: Sed Rate: 10 mm/hr (ref 0–30)

## 2017-07-23 ENCOUNTER — Other Ambulatory Visit: Payer: BLUE CROSS/BLUE SHIELD

## 2017-08-06 ENCOUNTER — Ambulatory Visit (INDEPENDENT_AMBULATORY_CARE_PROVIDER_SITE_OTHER): Payer: BLUE CROSS/BLUE SHIELD

## 2017-08-06 DIAGNOSIS — M545 Low back pain, unspecified: Secondary | ICD-10-CM

## 2017-08-06 DIAGNOSIS — K743 Primary biliary cirrhosis: Secondary | ICD-10-CM

## 2017-08-06 DIAGNOSIS — M542 Cervicalgia: Secondary | ICD-10-CM | POA: Diagnosis not present

## 2017-08-07 ENCOUNTER — Telehealth: Payer: Self-pay | Admitting: Neurology

## 2017-08-07 NOTE — Telephone Encounter (Signed)
Please call patient MRI of the cervical spine showed multiple degenerative changes, with evidence of moderate spinal stenosis at C6-7, moderate severe bilateral foraminal stenosis,  Please give him an early follow-up appointment to review MRIs  IMPRESSION:  Abnormal MRI cervical spine (without) demonstrating: 1. At C6-7: disc bulging, uncovertebral joint hypertrophy, facet hypertrophy with moderate spinal stenosis, and moderate-severe biforaminal stenosis  2. At C4-5: disc bulging, short pedicles, with mild spinal stenosis, severe right and moderate left foraminal stenosis  3. At C3-4: disc bulging, short pedicles, with mild spinal stenosis  4. At C7-T1: right uncovertebral joint hypertrophy with moderate right foraminal stenosis  5. At C5-6: right uncovertebral joint hypertrophy with mild right foraminal stenosis  6. No cord signal abnormalities.

## 2017-08-07 NOTE — Telephone Encounter (Signed)
Patient aware of results.  His appt has been moved to 08/12/17.

## 2017-08-12 ENCOUNTER — Encounter: Payer: Self-pay | Admitting: Neurology

## 2017-08-12 ENCOUNTER — Ambulatory Visit (INDEPENDENT_AMBULATORY_CARE_PROVIDER_SITE_OTHER): Payer: BLUE CROSS/BLUE SHIELD | Admitting: Neurology

## 2017-08-12 VITALS — BP 144/83 | HR 79 | Ht 69.0 in | Wt 227.0 lb

## 2017-08-12 DIAGNOSIS — M542 Cervicalgia: Secondary | ICD-10-CM | POA: Diagnosis not present

## 2017-08-12 NOTE — Progress Notes (Signed)
PATIENT: [name] DOB: 1966/01/03  Chief Complaint  Patient presents with  . Neck Pain    He would like to review his MRI.       HISTORICAL  [name] a 51 years old right-handed male, seen in refer by  His primary care doctorJohn, [name] for evaluation of back pain, initial evaluation was on July 03 2017  He had a history of hypertension, hyperlipidemia, primary biliary cirrhosis since 2005,he presented with abnormal liver functional test, the diagnosis was confirmed by abnormal liver biopsy  I was able to review liver biopsy report on August 27 2014 from [facility], chronic hepatitis with portal granulomas, increased numbers of plasma cells, and the patch eating for site mediated ductal injury, consistent with primary biliary cirrhosis/autoimmune hepatitis overlap,  He was taking ursodiol 500mg  from 2015-2018, now has stopped the medication due to multiple joints pain,  He complains of chronic upper back, shoulder pain, multiple joints pain, especially after prolonged airplane ride, when he get up take the first few steps, he felt bilateral lower extremity numb, stiffness, but denies persistent sensory loss, no weakness, no gait abnormality, prolonged walking will trigger more back pain, he denies bowel and bladder incontinence.  He also complains of worsening neck pain since 2017, radiating pain to right shoulder,  I was able to review laboratory evaluations, LDL was mildly elevated 143, cholesterol 243, triglyceride 225, normal PSA 0.56, TSH, CBC with elevated monocytes, abnormal liver functional task, elevated alkaline phosphate, mildly elevated AST, ALT, mild elevated glucose 146, A1c 8.3,  In 2015, negative hepatitis B core  antibody, hepatitis C, B surface antibody  UPDATE Sept 4th 2018: He still complains of intermittent leg numbness, he denies gait abnormality. We have personally reviewed MRI of the cervical spine in August 2018: Multilevel cervical  degenerative disc disease, moderate spinal stenosis at 6 7,, moderate to severe foraminal narrowing,  There is no evidence of cervical radiculopathy or myelopathy based on history and examinations, REVIEW OF SYSTEMS: Full 14 system review of systems performed and notable only for regarding still, joint pain, joint swelling, numbness  ALLERGIES: Allergies  Allergen Reactions  . Amlodipine Besylate Other (See Comments)    unknown  . Codeine Other (See Comments)    unknown  . Crestor [Rosuvastatin Calcium] Other (See Comments)    myalgia  . Lovastatin Other (See Comments)    myalgia  . Penicillins Other (See Comments)    unknown    HOME MEDICATIONS: Current Outpatient Prescriptions  Medication Sig Dispense Refill  . aspirin EC 81 MG tablet Take 1 tablet (81 mg total) by mouth daily. 90 tablet 11  . diltiazem (CARDIZEM CD) 300 MG 24 hr capsule Take 1 capsule (300 mg total) by mouth daily. 90 capsule 3  . ezetimibe (ZETIA) 10 MG tablet Take 1 tablet (10 mg total) by mouth daily. 90 tablet 3  . fexofenadine (ALLEGRA) 180 MG tablet Take 1 tablet (180 mg total) by mouth daily. 90 tablet 3  . mometasone (NASONEX) 50 MCG/ACT nasal spray Place 2 sprays into the nose daily. 17 g 11  . Multiple Vitamin (MULTIVITAMIN WITH MINERALS) TABS tablet Take 1 tablet by mouth daily.    Marland Kitchen olmesartan-hydrochlorothiazide (BENICAR HCT) 40-12.5 MG tablet Take 1 tablet by mouth daily. 90 tablet 3  . ursodiol (ACTIGALL) 500 MG tablet Take 1-2 tablets (500-1,000 mg total) by mouth 2 (two) times daily. Take 2 tablets in the am and 1 tablet in the pm 270 tablet  3   No current facility-administered medications for this visit.     PAST MEDICAL HISTORY: Past Medical History:  Diagnosis Date  . ALLERGIC RHINITIS 01/22/2008  . EPIDIDYMITIS 02/03/2009  . ERECTILE DYSFUNCTION 08/06/2007  . GERD 01/22/2008  . GOUT 01/22/2008  . 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 HYPERCHOLESTEROLEMIA 08/06/2007  . HYPERLIPIDEMIA  08/15/2007  . HYPERTENSION 08/15/2007  . NECK SPRAIN AND STRAIN 09/22/2009  . Primary biliary cirrhosis ([place]) 04/05/2011  . SKIN LESION 09/22/2009  . Thoracic back pain     PAST SURGICAL HISTORY: Past Surgical History:  Procedure Laterality Date  . left labrum tear surgury mar 2013    . PERCUTANEOUS LIVER BIOPSY  2005  . s/p septal nasal repair    . s/p sinus polyp removed      FAMILY HISTORY: Family History  Problem Relation Age of Onset  . Diabetes Mother   . Hypertension Mother   . Hyperlipidemia Mother   . Heart failure Mother   . Stroke Father   . Colon polyps Father   . Colon cancer Sister   . Colon polyps Sister   . Crohn's disease Sister   . Heart disease Maternal Grandmother   . Cancer Maternal Grandfather        unsure of type  . Cancer Paternal Grandmother        unsure of type  . Stroke Paternal Grandmother     SOCIAL HISTORY:  Social History   Social History  . Marital status: Married    Spouse name: N/A  . Number of children: 3  . Years of education: College   Occupational History  . VP of sales    Social History Main Topics  . Smoking status: Never Smoker  . Smokeless tobacco: Never Used  . Alcohol use Yes     Comment: Occasionally  . Drug use: No  . Sexual activity: Not on file   Other Topics Concern  . 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   Right-handed.        PHYSICAL EXAM   Vitals:   08/12/17 1508  BP: (!) 144/83  Pulse: 79  Weight: 227 lb (103 kg)  Height: 5\' 9"  (1.753 m)    Not recorded      Body mass index is 33.52 kg/m.  PHYSICAL EXAMNIATION:  Gen: NAD, conversant, well nourised, obese, well groomed                     Cardiovascular: Regular rate rhythm, no peripheral edema, warm, nontender. Eyes: Conjunctivae clear without exudates or hemorrhage Neck: Supple, no carotid bruits. Pulmonary: Clear to  auscultation bilaterally   NEUROLOGICAL EXAM:  MENTAL STATUS: Speech:    Speech is normal; fluent and spontaneous with normal comprehension.  Cognition:     Orientation to time, place and person     Normal recent and remote memory     Normal Attention span and concentration     Normal Language, naming, repeating,spontaneous speech     Fund of knowledge   CRANIAL NERVES: CN II: Visual fields are full to confrontation. Fundoscopic exam is normal with sharp discs and no vascular changes. Pupils are round equal and briskly reactive to light. CN III, IV, VI: extraocular movement are normal. No ptosis. CN V: Facial sensation is intact to pinprick in all 3 divisions bilaterally.  Corneal responses are intact.  CN VII: Face is symmetric with normal eye closure and smile. CN VIII: Hearing is normal to rubbing fingers CN IX, X: Palate elevates symmetrically. Phonation is normal. CN XI: Head turning and shoulder shrug are intact CN XII: Tongue is midline with normal movements and no atrophy.  MOTOR: There is no pronator drift of out-stretched arms. Muscle bulk and tone are normal. Muscle strength is normal.  REFLEXES: Reflexes are 2+ and symmetric at the biceps, triceps, knees, and ankles. Plantar responses are flexor.  SENSORY: Intact to light touch, pinprick, positional sensation and vibratory sensation are intact in fingers and toes.  COORDINATION: Rapid alternating movements and fine finger movements are intact. There is no dysmetria on finger-to-nose and heel-knee-shin.    GAIT/STANCE: Posture is normal. Gait is steady with normal steps, base, arm swing, and turning. Heel and toe walking are normal. Tandem gait is normal.  Romberg is absent.   DIAGNOSTIC DATA (LABS, IMAGING, TESTING) - I reviewed patient records, labs, notes, testing and imaging myself where available.   ASSESSMENT AND PLAN  [name] is a 51 y.o. male    Chronic neck pain, radiating pain to right  shoulder  Evidence of multilevel cervical degenerative disc disease moderate spinal canal stenosis at C6-7, moderate to severe bilateral foraminal narrowing  Continue moderate exercise, no evidence of cervical myelopathy, radiculopathy on examination,  Marcial Pacas, M.D. Ph.D.  [facility] [address], [place], [place] [address] Ph: [phone] Fax: [phone]  CC: Biagio Borg, MD

## 2017-09-24 IMAGING — MR MR THORACIC SPINE W/ CM
5 of 6 series · 39 of 48 positions shown · IV contrast (agent unspecified)
Comparison: 06/02/2017 noncontrast exam.

CLINICAL DATA: Spinal stenosis.  Suspected myelomalacia.

EXAM:
MRI THORACIC SPINE WITH CONTRAST
TECHNIQUE: Multiplanar, multisequence MR imaging of the thoracic spine was
performed following the administration of intravenous contrast.

[Series 16: t1_tse_sag count · sagittal · 3.0mm · 1.19mm/px · 4 of 13 slices shown]
[im 1/13]
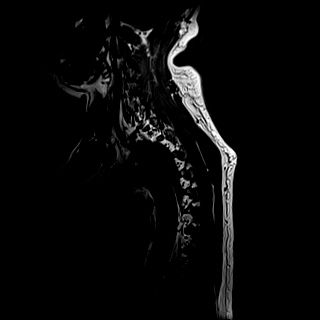
[im 5/13]
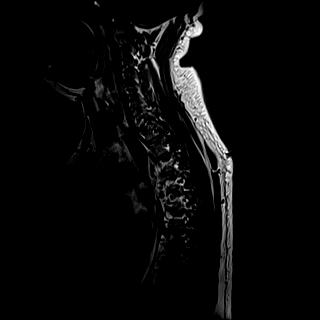
[im 9/13]
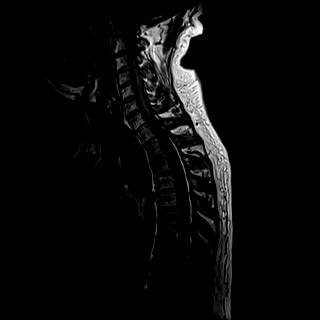
[im 13/13]
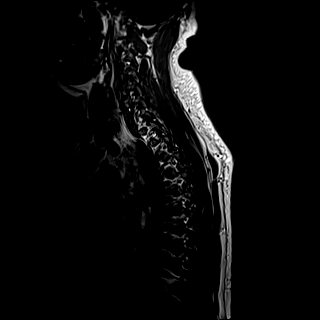

[Series 17: T1 · sagittal · 3.0mm · 0.71mm/px · 5 of 15 slices shown (1 of 2)]
[im 1/15]
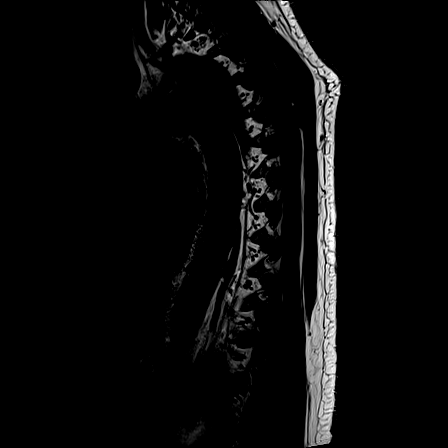
[im 4/15]
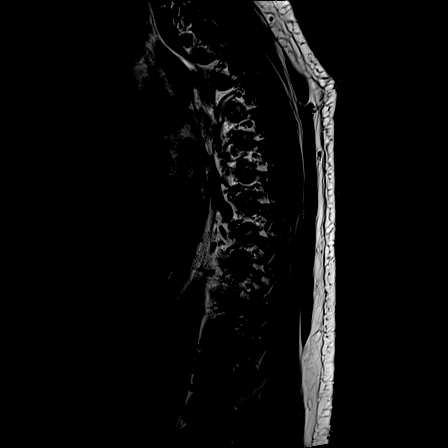
[im 8/15]
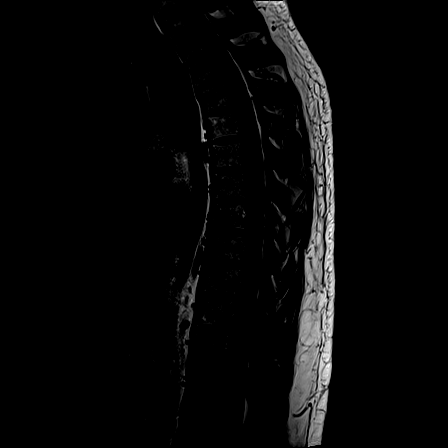
[im 11/15]
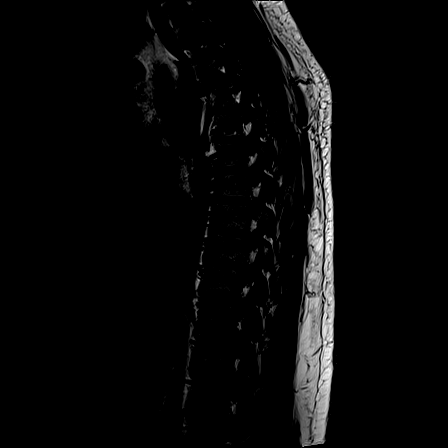
[im 15/15]
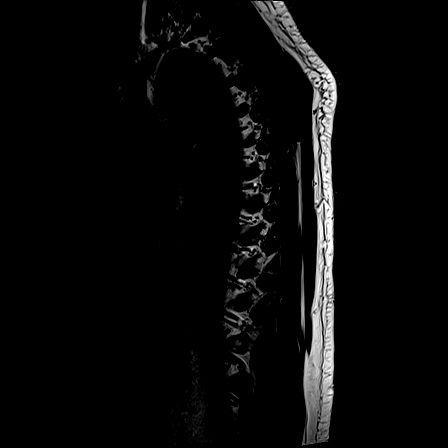

[Series 18: T1 · axial · non-contrast · 4.0mm · 0.56mm/px · z∈[-333,-93]mm · 15 of 48 slices shown (2 of 2)]
[im 1/48]
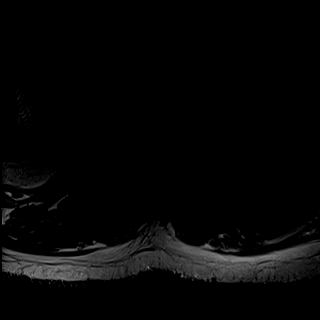
[im 4/48]
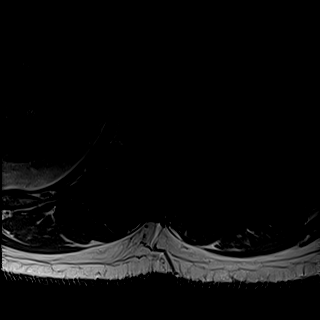
[im 7/48]
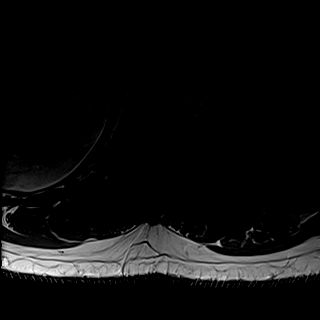
[im 11/48]
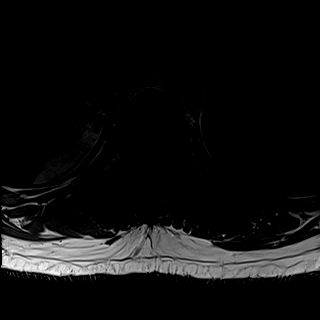
[im 14/48]
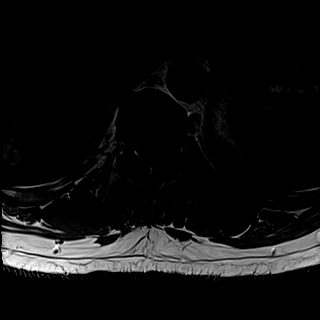
[im 17/48]
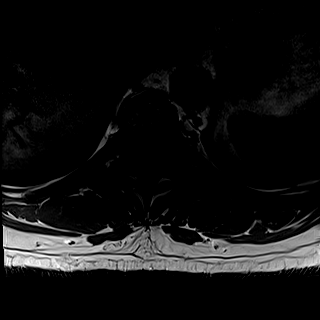
[im 21/48]
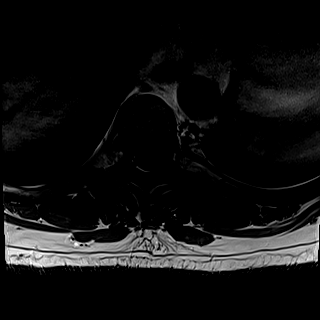
[im 24/48]
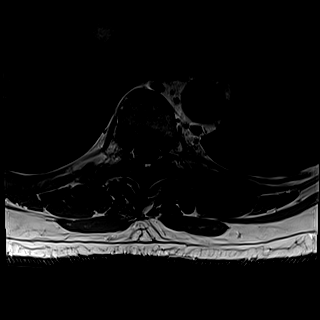
[im 27/48]
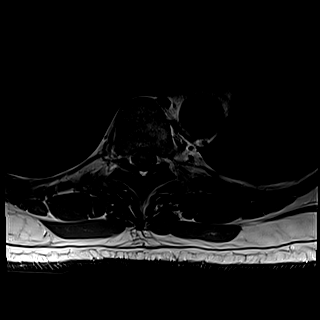
[im 31/48]
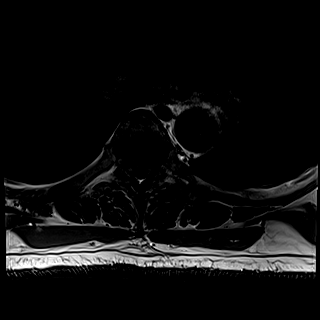
[im 34/48]
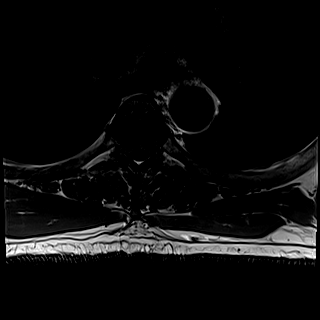
[im 37/48]
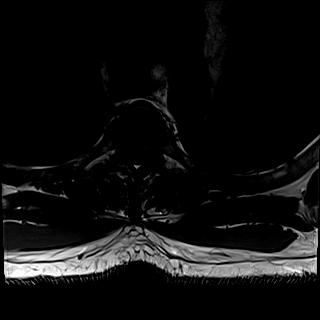
[im 41/48]
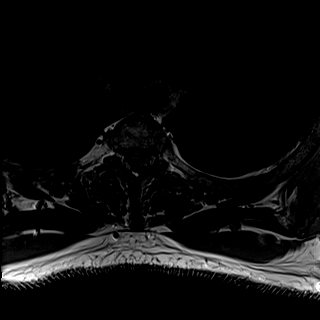
[im 44/48]
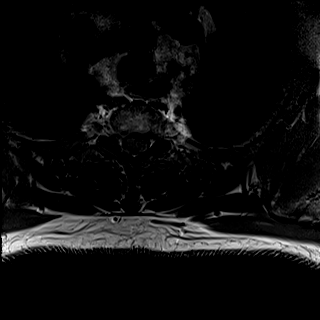
[im 48/48]
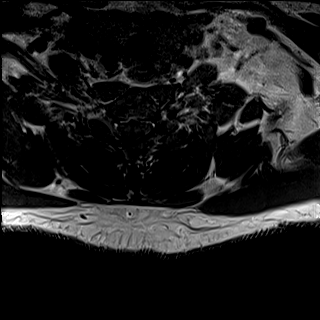

[Series 19: T1 fat-sat · sagittal · 3.0mm · 0.71mm/px · 5 of 15 slices shown]
[im 1/15]
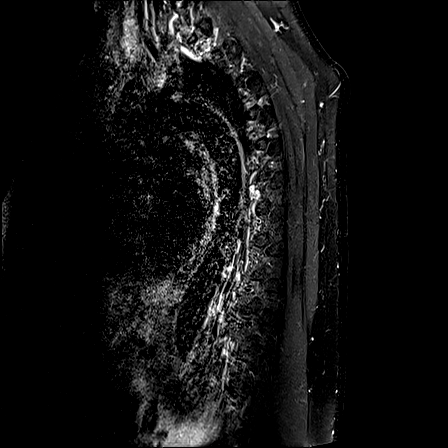
[im 4/15]
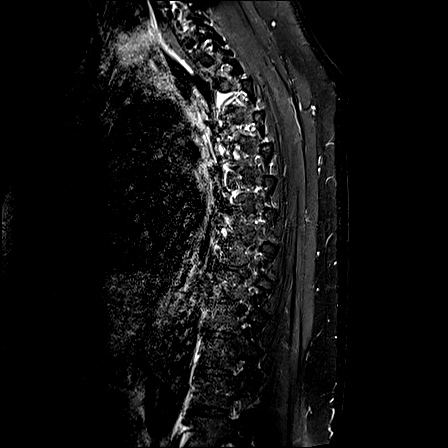
[im 8/15]
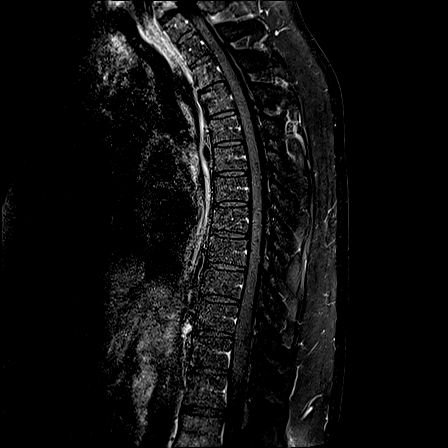
[im 11/15]
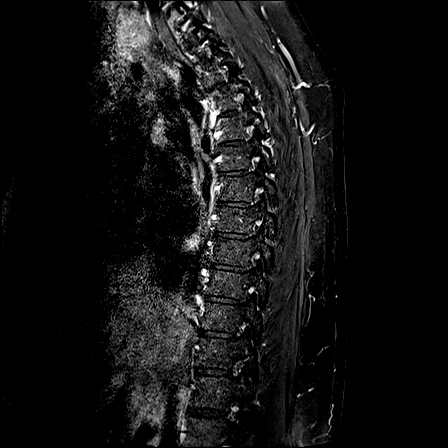
[im 15/15]
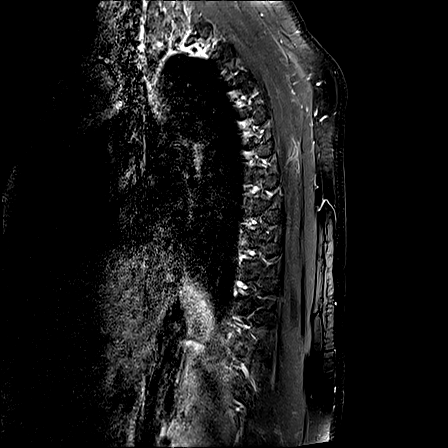

[Series 20: T1 post-contrast · axial · 4.0mm · 0.56mm/px · z∈[-333,-93]mm · 10 of 48 slices shown]
[im 1/48]
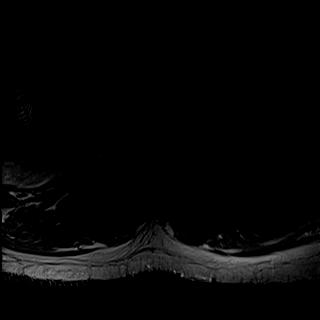
[im 4/48]
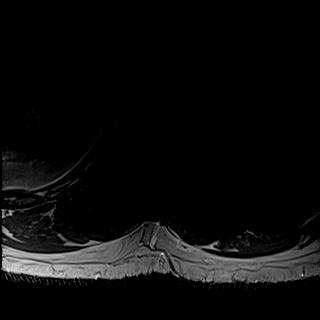
[im 7/48]
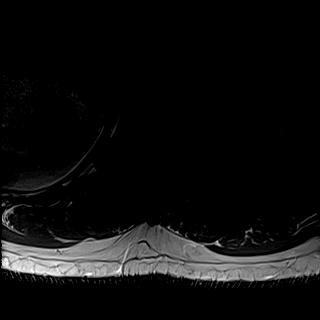
[im 14/48]
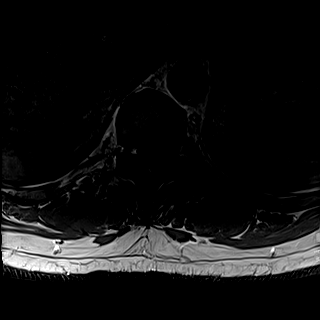
[im 21/48]
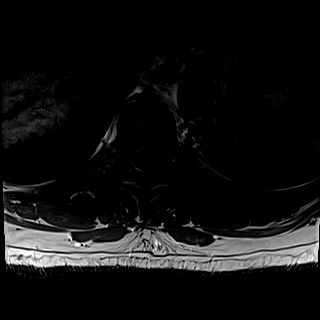
[im 24/48]
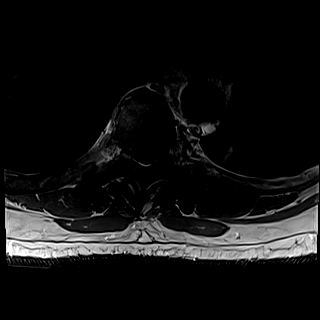
[im 27/48]
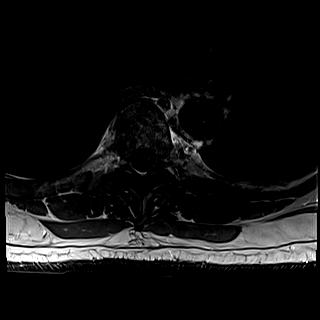
[im 34/48]
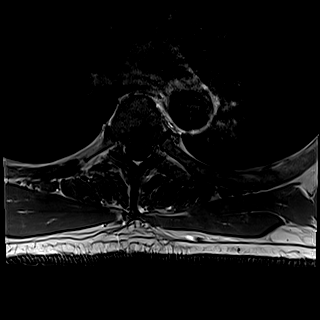
[im 41/48]
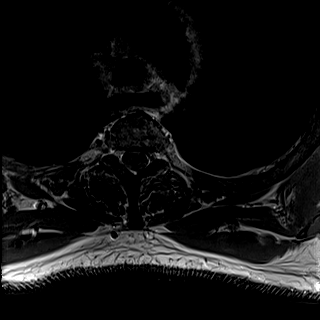
[im 48/48]
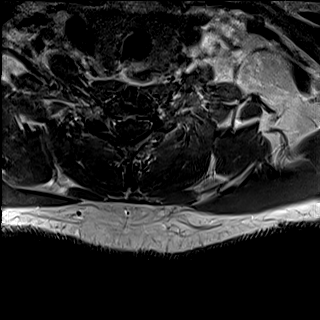

[39 of 48 positions shown; findings below may reference images not displayed]

FINDINGS: Localizer images were again performed to ensure precise numbering of
the thoracic spine.

Post infusion, there is no abnormal enhancement of the area of
concern at T10-T11. Dorsal myelomalacia of the thoracic cord is
established.

Post infusion imaging of the thoracic spine demonstrates no abnormal
enhancement of the vertebral bodies or paravertebral soft tissues.
IMPRESSION: No abnormal enhancement cord in the region of the previously
identified T2 hyperintensity. The findings are consistent with
thoracic cord myelomalacia due to T10-11 stenosis. No tumor or
active inflammation is identified.

## 2017-10-09 ENCOUNTER — Ambulatory Visit: Payer: BLUE CROSS/BLUE SHIELD | Admitting: Neurology

## 2017-10-28 ENCOUNTER — Ambulatory Visit: Payer: BLUE CROSS/BLUE SHIELD | Admitting: Internal Medicine

## 2017-10-28 ENCOUNTER — Other Ambulatory Visit: Payer: Self-pay | Admitting: Internal Medicine

## 2017-10-28 ENCOUNTER — Encounter: Payer: Self-pay | Admitting: Internal Medicine

## 2017-10-28 ENCOUNTER — Other Ambulatory Visit (INDEPENDENT_AMBULATORY_CARE_PROVIDER_SITE_OTHER): Payer: BLUE CROSS/BLUE SHIELD

## 2017-10-28 ENCOUNTER — Telehealth: Payer: Self-pay

## 2017-10-28 VITALS — BP 142/92 | HR 71 | Temp 97.9°F | Ht 69.0 in | Wt 228.0 lb

## 2017-10-28 DIAGNOSIS — E119 Type 2 diabetes mellitus without complications: Secondary | ICD-10-CM | POA: Diagnosis not present

## 2017-10-28 DIAGNOSIS — E7800 Pure hypercholesterolemia, unspecified: Secondary | ICD-10-CM | POA: Diagnosis not present

## 2017-10-28 DIAGNOSIS — Z0000 Encounter for general adult medical examination without abnormal findings: Secondary | ICD-10-CM | POA: Diagnosis not present

## 2017-10-28 DIAGNOSIS — I10 Essential (primary) hypertension: Secondary | ICD-10-CM

## 2017-10-28 LAB — HEPATIC FUNCTION PANEL
ALT: 49 U/L (ref 0–53)
AST: 35 U/L (ref 0–37)
Albumin: 4 g/dL (ref 3.5–5.2)
Alkaline Phosphatase: 96 U/L (ref 39–117)
BILIRUBIN DIRECT: 0.3 mg/dL (ref 0.0–0.3)
TOTAL PROTEIN: 8 g/dL (ref 6.0–8.3)
Total Bilirubin: 1 mg/dL (ref 0.2–1.2)

## 2017-10-28 LAB — LIPID PANEL
CHOLESTEROL: 245 mg/dL — AB (ref 0–200)
HDL: 44.2 mg/dL (ref >39.00)
LDL Cholesterol: 164 mg/dL — ABNORMAL HIGH (ref 0–99)
NONHDL: 200.32
TRIGLYCERIDES: 180 mg/dL — AB (ref 0.0–149.0)
Total CHOL/HDL Ratio: 6
VLDL: 36 mg/dL (ref 0.0–40.0)

## 2017-10-28 LAB — BASIC METABOLIC PANEL
BUN: 11 mg/dL (ref 6–23)
CALCIUM: 9.4 mg/dL (ref 8.4–10.5)
CO2: 32 mEq/L (ref 19–32)
Chloride: 100 mEq/L (ref 96–112)
Creatinine, Ser: 0.74 mg/dL (ref 0.40–1.50)
GFR: 142.97 mL/min (ref >60.00)
GLUCOSE: 144 mg/dL — AB (ref 70–99)
POTASSIUM: 4.1 meq/L (ref 3.5–5.1)
SODIUM: 138 meq/L (ref 135–145)

## 2017-10-28 LAB — HEMOGLOBIN A1C: Hgb A1c MFr Bld: 7.9 % — ABNORMAL HIGH (ref 4.6–6.5)

## 2017-10-28 MED ORDER — METFORMIN HCL ER 500 MG PO TB24: 500.0000 mg | ORAL_TABLET | Freq: Every day | ORAL | 3 refills | Status: DC

## 2017-10-28 NOTE — Assessment & Plan Note (Addendum)
stable overall by history and exam, recent data reviewed with pt, and pt to continue medical treatment as before,  to f/u any worsening symptoms or concerns, for f/u a1c, may need OHA

## 2017-10-28 NOTE — Telephone Encounter (Signed)
Pt has been informed, expressed understanding, will review them on MyChart.

## 2017-10-28 NOTE — Progress Notes (Signed)
Subjective:    Patient ID: [name], male    DOB: 09-17-1966, 51 y.o.   MRN: [id number]  HPI  Here to f/u; overall doing ok,  Pt denies chest pain, increasing sob or doe, wheezing, orthopnea, PND, increased LE swelling, palpitations, dizziness or syncope.  Pt denies new neurological symptoms such as new headache, or facial or extremity weakness or numbness.  Pt denies polydipsia, polyuria, or low sugar episode.  Pt states overall good compliance with meds, mostly trying to follow appropriate diet, with wt overall stable,  but little exercise however.  BP at home 138-142/80's; declines change for now. Gets somewhat anxious [place], had to stp rcently and let his daughter drive the next part of the way.   Past Medical History:  Diagnosis Date  . ALLERGIC RHINITIS 01/22/2008  . EPIDIDYMITIS 02/03/2009  . ERECTILE DYSFUNCTION 08/06/2007  . GERD 01/22/2008  . GOUT 01/22/2008  . 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 HYPERCHOLESTEROLEMIA 08/06/2007  . HYPERLIPIDEMIA 08/15/2007  . HYPERTENSION 08/15/2007  . NECK SPRAIN AND STRAIN 09/22/2009  . Primary biliary cirrhosis ([place]) 04/05/2011  . SKIN LESION 09/22/2009  . Thoracic back pain    Past Surgical History:  Procedure Laterality Date  . left labrum tear surgury mar 2013    . PERCUTANEOUS LIVER BIOPSY  2005  . s/p septal nasal repair    . s/p sinus polyp removed      reports that  has never smoked. he has never used smokeless tobacco. He reports that he drinks alcohol. He reports that he does not use drugs. family history includes Cancer in his maternal grandfather and paternal grandmother; Colon cancer in his sister; Colon polyps in his father and sister; Crohn's disease in his sister; Diabetes in his mother; Heart disease in his maternal grandmother; Heart failure in his mother; Hyperlipidemia in his mother; Hypertension in his mother; Stroke in his father and paternal grandmother. Allergies  Allergen  Reactions  . Amlodipine Besylate Other (See Comments)    unknown  . Codeine Other (See Comments)    unknown  . Crestor [Rosuvastatin Calcium] Other (See Comments)    myalgia  . Lovastatin Other (See Comments)    myalgia  . Penicillins Other (See Comments)    unknown   Current Outpatient Medications on File Prior to Visit  Medication Sig Dispense Refill  . aspirin EC 81 MG tablet Take 1 tablet (81 mg total) by mouth daily. 90 tablet 11  . diltiazem (CARDIZEM CD) 300 MG 24 hr capsule Take 1 capsule (300 mg total) by mouth daily. 90 capsule 3  . fexofenadine (ALLEGRA) 180 MG tablet Take 1 tablet (180 mg total) by mouth daily. 90 tablet 3  . mometasone (NASONEX) 50 MCG/ACT nasal spray Place 2 sprays into the nose daily. 17 g 11  . Multiple Vitamin (MULTIVITAMIN WITH MINERALS) TABS tablet Take 1 tablet by mouth daily.    Marland Kitchen olmesartan-hydrochlorothiazide (BENICAR HCT) 40-12.5 MG tablet Take 1 tablet by mouth daily. 90 tablet 3  . ursodiol (ACTIGALL) 500 MG tablet Take 1-2 tablets (500-1,000 mg total) by mouth 2 (two) times daily. Take 2 tablets in the am and 1 tablet in the pm 270 tablet 3   No current facility-administered medications on file prior to visit.    Review of Systems  Constitutional: Negative for other unusual diaphoresis or sweats HENT: Negative for ear discharge or swelling Eyes: Negative for other worsening visual disturbances Respiratory: Negative for stridor or  other swelling  Gastrointestinal: Negative for worsening distension or other blood Genitourinary: Negative for retention or other urinary change Musculoskeletal: Negative for other MSK pain or swelling Skin: Negative for color change or other new lesions Neurological: Negative for worsening tremors and other numbness  Psychiatric/Behavioral: Negative for worsening agitation or other fatigue All other system neg per pt    Objective:   Physical Exam BP (!) 142/92   Pulse 71   Temp 97.9 F (36.6 C) (Oral)    Ht 5\' 9"  (1.753 m)   Wt 228 lb (103.4 kg)   SpO2 99%   BMI 33.67 kg/m  VS noted,  Constitutional: Pt appears in NAD HENT: Head: NCAT.  Right Ear: External ear normal.  Left Ear: External ear normal.  Eyes: . Pupils are equal, round, and reactive to light. Conjunctivae and EOM are normal Nose: without d/c or deformity Neck: Neck supple. Gross normal ROM Cardiovascular: Normal rate and regular rhythm.   Pulmonary/Chest: Effort normal and breath sounds without rales or wheezing.  Abd:  Soft, NT, ND, + BS, no organomegaly Neurological: Pt is alert. At baseline orientation, motor grossly intact Skin: Skin is warm. No rashes, other new lesions, no LE edema Psychiatric: Pt behavior is normal without agitation  No other exam findings    Assessment & Plan:

## 2017-10-28 NOTE — Patient Instructions (Signed)

## 2017-10-28 NOTE — Telephone Encounter (Signed)
-----   Message from Biagio Borg, MD sent at 10/28/2017 12:48 PM EST ----- Left message on MyChart, pt to cont same tx except  The test results show that your current treatment is OK, except the A1c and the LDL cholesterol are both moderately elevated.  Please follow a lower cholesterol diet, as you have not been able to take the statins or zetia.  Please also start metformin ER 500 mg - 1 per day, and I would like to refer you to Diabetes Education class to learn more about how to work on the sugar.    Tallulah Hosman to please inform pt, I will do rx and referral

## 2017-10-28 NOTE — Assessment & Plan Note (Signed)
Has been statin and zetia intolerant per pt, for lower chol diet

## 2017-10-28 NOTE — Assessment & Plan Note (Signed)
BP Readings from Last 3 Encounters:  10/28/17 (!) 142/92  08/12/17 (!) 144/83  07/03/17 (!) 166/89  mild persistent elev here, o/w stable overall by history and exam, recent data reviewed with pt, and pt to continue medical treatment as before as he decliens further med change,  to f/u any worsening symptoms or concerns

## 2017-10-29 ENCOUNTER — Ambulatory Visit: Payer: BLUE CROSS/BLUE SHIELD | Admitting: Internal Medicine

## 2018-02-16 ENCOUNTER — Ambulatory Visit: Payer: BLUE CROSS/BLUE SHIELD | Admitting: Family Medicine

## 2018-02-16 ENCOUNTER — Encounter: Payer: Self-pay | Admitting: Family Medicine

## 2018-02-16 DIAGNOSIS — J069 Acute upper respiratory infection, unspecified: Secondary | ICD-10-CM | POA: Diagnosis not present

## 2018-02-16 MED ORDER — AZITHROMYCIN 250 MG PO TABS: ORAL_TABLET | ORAL | 0 refills | Status: DC

## 2018-02-16 NOTE — Patient Instructions (Signed)
Please try things such as zyrtec-D or allegra-D which is an antihistamine and decongestant.   Please try afrin which will help with nasal congestion but use for only three days.   Please also try using a netti pot on a regular occasion.  Honey can help with a sore throat.   vick's and delsym can help with cough.

## 2018-02-16 NOTE — Progress Notes (Signed)
ADE STMARIE - 52 y.o. male MRN [id number]  Date of birth: May 15, 1966  SUBJECTIVE:  Including CC & ROS.  Chief Complaint  Patient presents with  . Acute Visit    left side throat irritation    UNO ESAU is a 52 y.o. male that is preesenting with left cervical adenopathy. He has noticed this about a week ago. He has been fighting URI type symptoms for about 2 weeks. His daughter has been sick as well. He denies any fevers or ear pain.feels like his symptoms are staying the same. He has been flying frequently.   Review of Systems  Constitutional: Negative for fever.  HENT: Positive for congestion.   Respiratory: Positive for cough.   Cardiovascular: Negative for chest pain.  Gastrointestinal: Negative for abdominal pain.    HISTORY: Past Medical, Surgical, Social, and Family History Reviewed & Updated per EMR.   Pertinent Historical Findings include:  Past Medical History:  Diagnosis Date  . ALLERGIC RHINITIS 01/22/2008  . EPIDIDYMITIS 02/03/2009  . ERECTILE DYSFUNCTION 08/06/2007  . GERD 01/22/2008  . GOUT 01/22/2008  . 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 HYPERCHOLESTEROLEMIA 08/06/2007  . HYPERLIPIDEMIA 08/15/2007  . HYPERTENSION 08/15/2007  . NECK SPRAIN AND STRAIN 09/22/2009  . 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 SKIN LESION 09/22/2009  . Thoracic back pain     Past Surgical History:  Procedure Laterality Date  . left labrum tear surgury mar 2013    . PERCUTANEOUS LIVER BIOPSY  2005  . s/p septal nasal repair    . s/p sinus polyp removed      Allergies  Allergen Reactions  . Amlodipine Besylate Other (See Comments)    unknown  . Codeine Other (See Comments)    unknown  . Crestor [Rosuvastatin Calcium] Other (See Comments)    myalgia  . Lovastatin Other (See Comments)    myalgia  . Penicillins Other (See Comments)    unknown    Family History  Problem Relation Age of Onset  . Diabetes Mother   . Hypertension Mother   . Hyperlipidemia  Mother   . Heart failure Mother   . Stroke Father   . Colon polyps Father   . Colon cancer Sister   . Colon polyps Sister   . Crohn's disease Sister   . Heart disease Maternal Grandmother   . Cancer Maternal Grandfather        unsure of type  . Cancer Paternal Grandmother        unsure of type  . Stroke Paternal Grandmother      Social History   Socioeconomic History  . Marital status: Married    Spouse name: Not on file  . Number of children: 3  . Years of education: College  . Highest education level: Not on file  Social Needs  . Financial resource strain: Not on file  . Food insecurity - worry: Not on file  . Food insecurity - inability: Not on file  . Transportation needs - medical: Not on file  . Transportation needs - non-medical: Not on file  Occupational History  . Occupation: VP of sales  Tobacco Use  . Smoking status: Never Smoker  . Smokeless tobacco: Never Used  Substance and Sexual Activity  . Alcohol use: Yes    Comment: Occasionally  . Drug use: No  . Sexual activity: Not on file  Other Topics Concern  . Not on file  Social History Narrative   Married 1 son 2 daughters  VP sales [employer]   1-2 cups coffee per day.   Right-handed.     PHYSICAL EXAM:  VS: BP (!) 156/80 (BP Location: Left Arm, Patient Position: Sitting, Cuff Size: Large)   Pulse 78   Temp 98.6 F (37 C) (Oral)   Wt 229 lb (103.9 kg)   SpO2 98%   BMI 33.82 kg/m  Physical Exam Gen: NAD, alert, cooperative with exam, well-appearing ENT: normal lips, normal nasal mucosa, tympanic membranes clear and intact bilaterally, normal oropharynx, left cervical cervical lymphadenopathy Eye: normal EOM, normal conjunctiva and lids CV:  no edema, +2 pedal pulses, regular rate and rhythm, S1-S2   Resp: no accessory muscle use, non-labored, clear to auscultation bilaterally, no crackles or wheezes Skin: no rashes, no areas of induration  Neuro: normal tone,  normal sensation to touch Psych:  normal insight, alert and oriented MSK: Normal gait, normal strength       ASSESSMENT & PLAN:   URI (upper respiratory infection) Symptoms are likely viral.  - counseled on supportive care. - Printed Prescription azithromycin to have on hand - f/u prn

## 2018-02-16 NOTE — Assessment & Plan Note (Signed)
Symptoms are likely viral.  - counseled on supportive care. - Printed Prescription azithromycin to have on hand - f/u prn

## 2018-04-13 ENCOUNTER — Telehealth: Payer: Self-pay | Admitting: Internal Medicine

## 2018-04-13 MED ORDER — OLMESARTAN MEDOXOMIL-HCTZ 40-12.5 MG PO TABS: 1.0000 | ORAL_TABLET | Freq: Every day | ORAL | 0 refills | Status: DC

## 2018-04-13 NOTE — Telephone Encounter (Signed)
Copied from [place] [phone]. Topic: Quick Communication - Rx Refill/Question >> Apr 13, 2018 10:12 AM Percell Belt A wrote: Medication: olmesartan-hydrochlorothiazide (BENICAR HCT) 40-12.5 MG tablet [[id number]]  Has the patient contacted their pharmacy? No  (Agent: If no, request that the patient contact the pharmacy for the refill.) Preferred Pharmacy (with phone number or street name): [facility] [place] - pt has an appt on 5/24 Agent: Please be advised that RX refills may take up to 3 business days. We ask that you follow-up with your pharmacy.

## 2018-04-13 NOTE — Telephone Encounter (Signed)
30- day refill given until seen for appt on 5/24.

## 2018-04-26 ENCOUNTER — Other Ambulatory Visit: Payer: Self-pay | Admitting: Internal Medicine

## 2018-05-01 ENCOUNTER — Encounter: Payer: Self-pay | Admitting: Internal Medicine

## 2018-05-01 ENCOUNTER — Other Ambulatory Visit (INDEPENDENT_AMBULATORY_CARE_PROVIDER_SITE_OTHER): Payer: BLUE CROSS/BLUE SHIELD

## 2018-05-01 ENCOUNTER — Other Ambulatory Visit: Payer: Self-pay | Admitting: Internal Medicine

## 2018-05-01 ENCOUNTER — Ambulatory Visit: Payer: BLUE CROSS/BLUE SHIELD | Admitting: Internal Medicine

## 2018-05-01 VITALS — BP 150/92 | HR 71 | Ht 69.0 in | Wt 225.0 lb

## 2018-05-01 DIAGNOSIS — Z114 Encounter for screening for human immunodeficiency virus [HIV]: Secondary | ICD-10-CM

## 2018-05-01 DIAGNOSIS — E119 Type 2 diabetes mellitus without complications: Secondary | ICD-10-CM

## 2018-05-01 DIAGNOSIS — Z0000 Encounter for general adult medical examination without abnormal findings: Secondary | ICD-10-CM | POA: Diagnosis not present

## 2018-05-01 DIAGNOSIS — Z0001 Encounter for general adult medical examination with abnormal findings: Secondary | ICD-10-CM

## 2018-05-01 DIAGNOSIS — Z9109 Other allergy status, other than to drugs and biological substances: Secondary | ICD-10-CM

## 2018-05-01 DIAGNOSIS — E7800 Pure hypercholesterolemia, unspecified: Secondary | ICD-10-CM | POA: Diagnosis not present

## 2018-05-01 LAB — CBC WITH DIFFERENTIAL/PLATELET
BASOS PCT: 0.5 % (ref 0.0–3.0)
Basophils Absolute: 0 10*3/uL (ref 0.0–0.1)
EOS PCT: 2.3 % (ref 0.0–5.0)
Eosinophils Absolute: 0.1 10*3/uL (ref 0.0–0.7)
HCT: 43.3 % (ref 39.0–52.0)
HEMOGLOBIN: 15 g/dL (ref 13.0–17.0)
LYMPHS ABS: 1.2 10*3/uL (ref 0.7–4.0)
Lymphocytes Relative: 30.8 % (ref 12.0–46.0)
MCHC: 34.5 g/dL (ref 30.0–36.0)
MCV: 87.6 fl (ref 78.0–100.0)
MONO ABS: 0.5 10*3/uL (ref 0.1–1.0)
Monocytes Relative: 14.5 % — ABNORMAL HIGH (ref 3.0–12.0)
NEUTROS PCT: 51.9 % (ref 43.0–77.0)
Neutro Abs: 2 10*3/uL (ref 1.4–7.7)
Platelets: 149 10*3/uL — ABNORMAL LOW (ref 150.0–400.0)
RBC: 4.94 Mil/uL (ref 4.22–5.81)
RDW: 13.5 % (ref 11.5–15.5)
WBC: 3.8 10*3/uL — ABNORMAL LOW (ref 4.0–10.5)

## 2018-05-01 LAB — LIPID PANEL
Cholesterol: 280 mg/dL — ABNORMAL HIGH (ref 0–200)
HDL: 47.1 mg/dL (ref >39.00)
NONHDL: 233.29
Total CHOL/HDL Ratio: 6
Triglycerides: 240 mg/dL — ABNORMAL HIGH (ref 0.0–149.0)
VLDL: 48 mg/dL — ABNORMAL HIGH (ref 0.0–40.0)

## 2018-05-01 LAB — URINALYSIS, ROUTINE W REFLEX MICROSCOPIC
Bilirubin Urine: NEGATIVE
Hgb urine dipstick: NEGATIVE
KETONES UR: NEGATIVE
Leukocytes, UA: NEGATIVE
Nitrite: NEGATIVE
PH: 6.5 (ref 5.0–8.0)
SPECIFIC GRAVITY, URINE: 1.01 (ref 1.000–1.030)
Total Protein, Urine: NEGATIVE
UROBILINOGEN UA: 0.2 (ref 0.0–1.0)
Urine Glucose: NEGATIVE
WBC, UA: NONE SEEN (ref 0–?)

## 2018-05-01 LAB — HEPATIC FUNCTION PANEL
ALK PHOS: 126 U/L — AB (ref 39–117)
ALT: 60 U/L — AB (ref 0–53)
AST: 38 U/L — AB (ref 0–37)
Albumin: 4.2 g/dL (ref 3.5–5.2)
BILIRUBIN TOTAL: 1.1 mg/dL (ref 0.2–1.2)
Bilirubin, Direct: 0.3 mg/dL (ref 0.0–0.3)
Total Protein: 7.9 g/dL (ref 6.0–8.3)

## 2018-05-01 LAB — LDL CHOLESTEROL, DIRECT: LDL DIRECT: 175 mg/dL

## 2018-05-01 LAB — MICROALBUMIN / CREATININE URINE RATIO
CREATININE, U: 52.3 mg/dL
MICROALB UR: 0.8 mg/dL (ref 0.0–1.9)
MICROALB/CREAT RATIO: 1.5 mg/g (ref 0.0–30.0)

## 2018-05-01 LAB — BASIC METABOLIC PANEL
BUN: 8 mg/dL (ref 6–23)
CALCIUM: 9.6 mg/dL (ref 8.4–10.5)
CO2: 29 mEq/L (ref 19–32)
CREATININE: 0.73 mg/dL (ref 0.40–1.50)
Chloride: 96 mEq/L (ref 96–112)
GFR: 144.94 mL/min (ref >60.00)
GLUCOSE: 191 mg/dL — AB (ref 70–99)
Potassium: 3.6 mEq/L (ref 3.5–5.1)
SODIUM: 135 meq/L (ref 135–145)

## 2018-05-01 LAB — TSH: TSH: 0.79 u[IU]/mL (ref 0.35–4.50)

## 2018-05-01 LAB — HEMOGLOBIN A1C: HEMOGLOBIN A1C: 10.3 % — AB (ref 4.6–6.5)

## 2018-05-01 LAB — PSA: PSA: 0.56 ng/mL (ref 0.10–4.00)

## 2018-05-01 MED ORDER — MOMETASONE FUROATE 50 MCG/ACT NA SUSP: 2.0000 | Freq: Every day | NASAL | 11 refills | Status: DC

## 2018-05-01 MED ORDER — OLMESARTAN MEDOXOMIL-HCTZ 40-12.5 MG PO TABS: 1.0000 | ORAL_TABLET | Freq: Every day | ORAL | 3 refills | Status: DC

## 2018-05-01 MED ORDER — METFORMIN HCL ER 500 MG PO TB24: 1000.0000 mg | ORAL_TABLET | Freq: Every day | ORAL | 3 refills | Status: DC

## 2018-05-01 MED ORDER — DILTIAZEM HCL ER COATED BEADS 300 MG PO CP24
300.0000 mg | ORAL_CAPSULE | Freq: Every day | ORAL | 3 refills | Status: DC
Start: 2018-05-01 — End: 2019-06-23

## 2018-05-01 NOTE — Assessment & Plan Note (Signed)
Declines further tx with statins, for diet

## 2018-05-01 NOTE — Patient Instructions (Addendum)
Please remember to see your eye doctor once per year.   Please continue all other medications as before, and refills have been done if requested.  Please have the pharmacy call with any other refills you may need.  Please continue your efforts at being more active, low cholesterol diet, and weight control.  You are otherwise up to date with prevention measures today.  Please keep your appointments with your specialists as you may have planned  Please go to the LAB in the Basement (turn left off the elevator) for the tests to be done today  You will be contacted by phone if any changes need to be made immediately.  Otherwise, you will receive a letter about your results with an explanation, but please check with MyChart first.  Please remember to sign up for MyChart if you have not done so, as this will be important to you in the future with finding out test results, communicating by private email, and scheduling acute appointments online when needed.  Please return in 6 months, or sooner if needed, with Lab testing done 3-5 days before

## 2018-05-01 NOTE — Progress Notes (Signed)
Subjective:    Patient ID: [name], male    DOB: 26-Jun-1966, 52 y.o.   MRN: [id number]  HPI  Here for wellness and f/u;  Overall doing ok;  Pt denies Chest pain, worsening SOB, DOE, wheezing, orthopnea, PND, worsening LE edema, palpitations, dizziness or syncope.  Pt denies neurological change such as new headache, facial or extremity weakness.  Pt denies polydipsia, polyuria, or low sugar symptoms. Pt states overall good compliance with treatment and medications, good tolerability, and has been trying to follow appropriate diet.  Pt denies worsening depressive symptoms, suicidal ideation or panic. No fever, night sweats, loss of appetite, or other constitutional symptoms.  Pt states good ability with ADL's, has low fall risk, home safety reviewed and adequate, no other significant changes in hearing or vision, and only occasionally active with exercise. Lost wt with better diet.  Did not try the metformin yet.   Wt Readings from Last 3 Encounters:  05/01/18 225 lb (102.1 kg)  02/16/18 229 lb (103.9 kg)  10/28/17 228 lb (103.4 kg)  Due for optho eval, plans to call on his own. No other new complaints or interval hx.  Did not start metformin, trying to work on diet Past Medical History:  Diagnosis Date  . ALLERGIC RHINITIS 01/22/2008  . EPIDIDYMITIS 02/03/2009  . ERECTILE DYSFUNCTION 08/06/2007  . GERD 01/22/2008  . GOUT 01/22/2008  . 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 HYPERCHOLESTEROLEMIA 08/06/2007  . HYPERLIPIDEMIA 08/15/2007  . HYPERTENSION 08/15/2007  . NECK SPRAIN AND STRAIN 09/22/2009  . 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 SKIN LESION 09/22/2009  . Thoracic back pain    Past Surgical History:  Procedure Laterality Date  . left labrum tear surgury mar 2013    . PERCUTANEOUS LIVER BIOPSY  2005  . s/p septal nasal repair    . s/p sinus polyp removed      reports that he has never smoked. He has never used smokeless tobacco. He reports that he drinks alcohol. He  reports that he does not use drugs. family history includes Cancer in his maternal grandfather and paternal grandmother; Colon cancer in his sister; Colon polyps in his father and sister; Crohn's disease in his sister; Diabetes in his mother; Heart disease in his maternal grandmother; Heart failure in his mother; Hyperlipidemia in his mother; Hypertension in his mother; Stroke in his father and paternal grandmother. Allergies  Allergen Reactions  . Amlodipine Besylate Other (See Comments)    unknown  . Codeine Other (See Comments)    unknown  . Crestor [Rosuvastatin Calcium] Other (See Comments)    myalgia  . Lovastatin Other (See Comments)    myalgia  . Penicillins Other (See Comments)    unknown   Current Outpatient Medications on File Prior to Visit  Medication Sig Dispense Refill  . aspirin EC 81 MG tablet Take 1 tablet (81 mg total) by mouth daily. 90 tablet 11  . fexofenadine (ALLEGRA) 180 MG tablet Take 1 tablet (180 mg total) by mouth daily. 90 tablet 3  . Multiple Vitamin (MULTIVITAMIN WITH MINERALS) TABS tablet Take 1 tablet by mouth daily.    . ursodiol (ACTIGALL) 500 MG tablet Take 1-2 tablets (500-1,000 mg total) by mouth 2 (two) times daily. Take 2 tablets in the am and 1 tablet in the pm (Patient not taking: Reported on 05/01/2018) 270 tablet 3   No current facility-administered medications on file prior to visit.    Review of Systems Constitutional: Negative for other  unusual diaphoresis, sweats, appetite or weight changes HENT: Negative for other worsening hearing loss, ear pain, facial swelling, mouth sores or neck stiffness.   Eyes: Negative for other worsening pain, redness or other visual disturbance.  Respiratory: Negative for other stridor or swelling Cardiovascular: Negative for other palpitations or other chest pain  Gastrointestinal: Negative for worsening diarrhea or loose stools, blood in stool, distention or other pain Genitourinary: Negative for hematuria,  flank pain or other change in urine volume.  Musculoskeletal: Negative for myalgias or other joint swelling.  Skin: Negative for other color change, or other wound or worsening drainage.  Neurological: Negative for other syncope or numbness. Hematological: Negative for other adenopathy or swelling Psychiatric/Behavioral: Negative for hallucinations, other worsening agitation, SI, self-injury, or new decreased concentration All other system neg per pt    Objective:   Physical Exam BP (!) 150/92 (BP Location: Left Arm, Patient Position: Sitting, Cuff Size: Large)   Pulse 71   Ht 5\' 9"  (1.753 m)   Wt 225 lb (102.1 kg)   SpO2 96%   BMI 33.23 kg/m  VS noted,  Constitutional: Pt is oriented to person, place, and time. Appears well-developed and well-nourished, in no significant distress and comfortable Head: Normocephalic and atraumatic  Eyes: Conjunctivae and EOM are normal. Pupils are equal, round, and reactive to light Right Ear: External ear normal without discharge Left Ear: External ear normal without discharge Nose: Nose without discharge or deformity Mouth/Throat: Oropharynx is without other ulcerations and moist  Neck: Normal range of motion. Neck supple. No JVD present. No tracheal deviation present or significant neck LA or mass Cardiovascular: Normal rate, regular rhythm, normal heart sounds and intact distal pulses.   Pulmonary/Chest: WOB normal and breath sounds without rales or wheezing  Abdominal: Soft. Bowel sounds are normal. NT. No HSM  Musculoskeletal: Normal range of motion. Exhibits no edema Lymphadenopathy: Has no other cervical adenopathy.  Neurological: Pt is alert and oriented to person, place, and time. Pt has normal reflexes. No cranial nerve deficit. Motor grossly intact, Gait intact Skin: Skin is warm and dry. No rash noted or new ulcerations Psychiatric:  Has normal mood and affect. Behavior is normal without agitation No other exam findings         Assessment & Plan:

## 2018-05-02 ENCOUNTER — Encounter: Payer: Self-pay | Admitting: Internal Medicine

## 2018-05-02 LAB — HIV ANTIBODY (ROUTINE TESTING W REFLEX): HIV 1&2 Ab, 4th Generation: NONREACTIVE

## 2018-05-02 NOTE — Assessment & Plan Note (Signed)
[facility] for [employer] and nasacort asd,  to f/u any worsening symptoms or concerns

## 2018-05-02 NOTE — Assessment & Plan Note (Signed)
stable overall by history and exam, and pt to continue medical treatment as before,  to f/u any worsening symptoms or concerns, for f/u lab 

## 2018-05-02 NOTE — Assessment & Plan Note (Signed)

## 2018-05-05 ENCOUNTER — Telehealth: Payer: Self-pay

## 2018-05-05 NOTE — Telephone Encounter (Signed)
-----   Message from Biagio Borg, MD sent at 05/01/2018  4:30 PM EDT ----- Left message on MyChart, pt to cont same tx except  The test results show that your current treatment is OK, except the LDL cholesterol is quite high, and the A1c is much worse.  Please start the metformin at 2 pills in the AM (I will send a new prescription), and follow a lower cholesterol diet.Redmond Baseman to please inform pt, I will do rx

## 2018-05-05 NOTE — Telephone Encounter (Signed)
Pt has viewed results via MyChart  

## 2018-09-23 DIAGNOSIS — Z23 Encounter for immunization: Secondary | ICD-10-CM | POA: Diagnosis not present

## 2018-10-19 DIAGNOSIS — J00 Acute nasopharyngitis [common cold]: Secondary | ICD-10-CM | POA: Diagnosis not present

## 2018-10-19 DIAGNOSIS — I10 Essential (primary) hypertension: Secondary | ICD-10-CM | POA: Diagnosis not present

## 2018-12-09 HISTORY — PX: COLONOSCOPY W/ BIOPSIES: SHX1374

## 2019-05-06 ENCOUNTER — Encounter: Payer: BLUE CROSS/BLUE SHIELD | Admitting: Internal Medicine

## 2019-05-10 ENCOUNTER — Other Ambulatory Visit: Payer: Self-pay | Admitting: Internal Medicine

## 2019-06-04 ENCOUNTER — Other Ambulatory Visit: Payer: Self-pay | Admitting: Internal Medicine

## 2019-06-16 ENCOUNTER — Encounter: Payer: BLUE CROSS/BLUE SHIELD | Admitting: Internal Medicine

## 2019-06-18 ENCOUNTER — Other Ambulatory Visit: Payer: Self-pay | Admitting: Internal Medicine

## 2019-06-23 ENCOUNTER — Other Ambulatory Visit: Payer: Self-pay

## 2019-06-23 ENCOUNTER — Ambulatory Visit (INDEPENDENT_AMBULATORY_CARE_PROVIDER_SITE_OTHER): Payer: BC Managed Care – PPO | Admitting: Internal Medicine

## 2019-06-23 ENCOUNTER — Encounter: Payer: Self-pay | Admitting: Internal Medicine

## 2019-06-23 ENCOUNTER — Other Ambulatory Visit (INDEPENDENT_AMBULATORY_CARE_PROVIDER_SITE_OTHER): Payer: BC Managed Care – PPO

## 2019-06-23 ENCOUNTER — Other Ambulatory Visit: Payer: Self-pay | Admitting: Internal Medicine

## 2019-06-23 VITALS — BP 144/96 | HR 74 | Temp 98.5°F | Ht 69.0 in | Wt 213.0 lb

## 2019-06-23 DIAGNOSIS — E611 Iron deficiency: Secondary | ICD-10-CM

## 2019-06-23 DIAGNOSIS — K743 Primary biliary cirrhosis: Secondary | ICD-10-CM

## 2019-06-23 DIAGNOSIS — E538 Deficiency of other specified B group vitamins: Secondary | ICD-10-CM

## 2019-06-23 DIAGNOSIS — Z0000 Encounter for general adult medical examination without abnormal findings: Secondary | ICD-10-CM | POA: Diagnosis not present

## 2019-06-23 DIAGNOSIS — E559 Vitamin D deficiency, unspecified: Secondary | ICD-10-CM

## 2019-06-23 DIAGNOSIS — E119 Type 2 diabetes mellitus without complications: Secondary | ICD-10-CM

## 2019-06-23 DIAGNOSIS — Z125 Encounter for screening for malignant neoplasm of prostate: Secondary | ICD-10-CM

## 2019-06-23 DIAGNOSIS — I10 Essential (primary) hypertension: Secondary | ICD-10-CM

## 2019-06-23 DIAGNOSIS — Z9109 Other allergy status, other than to drugs and biological substances: Secondary | ICD-10-CM

## 2019-06-23 DIAGNOSIS — Z23 Encounter for immunization: Secondary | ICD-10-CM

## 2019-06-23 LAB — CBC WITH DIFFERENTIAL/PLATELET
Basophils Absolute: 0 10*3/uL (ref 0.0–0.1)
Basophils Relative: 0.8 % (ref 0.0–3.0)
Eosinophils Absolute: 0.1 10*3/uL (ref 0.0–0.7)
Eosinophils Relative: 2.1 % (ref 0.0–5.0)
HCT: 43.1 % (ref 39.0–52.0)
Hemoglobin: 14.4 g/dL (ref 13.0–17.0)
Lymphocytes Relative: 33.3 % (ref 12.0–46.0)
Lymphs Abs: 1.7 10*3/uL (ref 0.7–4.0)
MCHC: 33.4 g/dL (ref 30.0–36.0)
MCV: 88.8 fl (ref 78.0–100.0)
Monocytes Absolute: 0.9 10*3/uL (ref 0.1–1.0)
Monocytes Relative: 16.9 % — ABNORMAL HIGH (ref 3.0–12.0)
Neutro Abs: 2.5 10*3/uL (ref 1.4–7.7)
Neutrophils Relative %: 46.9 % (ref 43.0–77.0)
Platelets: 159 10*3/uL (ref 150.0–400.0)
RBC: 4.85 Mil/uL (ref 4.22–5.81)
RDW: 13.7 % (ref 11.5–15.5)
WBC: 5.3 10*3/uL (ref 4.0–10.5)

## 2019-06-23 LAB — URINALYSIS, ROUTINE W REFLEX MICROSCOPIC
Bilirubin Urine: NEGATIVE
Hgb urine dipstick: NEGATIVE
Ketones, ur: NEGATIVE
Leukocytes,Ua: NEGATIVE
Nitrite: NEGATIVE
RBC / HPF: NONE SEEN (ref 0–?)
Specific Gravity, Urine: <=1.005 — AB (ref 1.000–1.030)
Total Protein, Urine: NEGATIVE
Urine Glucose: NEGATIVE
Urobilinogen, UA: 0.2 (ref 0.0–1.0)
pH: 6.5 (ref 5.0–8.0)

## 2019-06-23 LAB — BASIC METABOLIC PANEL
BUN: 8 mg/dL (ref 6–23)
CO2: 31 mEq/L (ref 19–32)
Calcium: 9 mg/dL (ref 8.4–10.5)
Chloride: 98 mEq/L (ref 96–112)
Creatinine, Ser: 0.77 mg/dL (ref 0.40–1.50)
GFR: 127.67 mL/min (ref >60.00)
Glucose, Bld: 81 mg/dL (ref 70–99)
Potassium: 3.7 mEq/L (ref 3.5–5.1)
Sodium: 137 mEq/L (ref 135–145)

## 2019-06-23 LAB — IBC PANEL
Iron: 90 ug/dL (ref 42–165)
Saturation Ratios: 23 % (ref 20.0–50.0)
Transferrin: 279 mg/dL (ref 212.0–360.0)

## 2019-06-23 LAB — HEPATIC FUNCTION PANEL
ALT: 53 U/L (ref 0–53)
AST: 40 U/L — ABNORMAL HIGH (ref 0–37)
Albumin: 4.5 g/dL (ref 3.5–5.2)
Alkaline Phosphatase: 119 U/L — ABNORMAL HIGH (ref 39–117)
Bilirubin, Direct: 0.3 mg/dL (ref 0.0–0.3)
Total Bilirubin: 1 mg/dL (ref 0.2–1.2)
Total Protein: 8 g/dL (ref 6.0–8.3)

## 2019-06-23 LAB — VITAMIN D 25 HYDROXY (VIT D DEFICIENCY, FRACTURES): VITD: 22.17 ng/mL — ABNORMAL LOW (ref 30.00–100.00)

## 2019-06-23 LAB — TSH: TSH: 1.35 u[IU]/mL (ref 0.35–4.50)

## 2019-06-23 LAB — LIPID PANEL
Cholesterol: 251 mg/dL — ABNORMAL HIGH (ref 0–200)
HDL: 53 mg/dL (ref >39.00)
LDL Cholesterol: 171 mg/dL — ABNORMAL HIGH (ref 0–99)
NonHDL: 198.25
Total CHOL/HDL Ratio: 5
Triglycerides: 134 mg/dL (ref 0.0–149.0)
VLDL: 26.8 mg/dL (ref 0.0–40.0)

## 2019-06-23 LAB — MICROALBUMIN / CREATININE URINE RATIO
Creatinine,U: 41.8 mg/dL
Microalb Creat Ratio: 1.7 mg/g (ref 0.0–30.0)
Microalb, Ur: <0.7 mg/dL (ref 0.0–1.9)

## 2019-06-23 LAB — HEMOGLOBIN A1C: Hgb A1c MFr Bld: 6.7 % — ABNORMAL HIGH (ref 4.6–6.5)

## 2019-06-23 LAB — PSA: PSA: 0.55 ng/mL (ref 0.10–4.00)

## 2019-06-23 LAB — VITAMIN B12: Vitamin B-12: 497 pg/mL (ref 211–911)

## 2019-06-23 MED ORDER — OLMESARTAN MEDOXOMIL-HCTZ 40-12.5 MG PO TABS: 1.0000 | ORAL_TABLET | Freq: Every day | ORAL | 3 refills | Status: DC

## 2019-06-23 MED ORDER — DILTIAZEM HCL ER COATED BEADS 300 MG PO CP24: 300.0000 mg | ORAL_CAPSULE | Freq: Every day | ORAL | 3 refills | Status: DC

## 2019-06-23 MED ORDER — VITAMIN D (ERGOCALCIFEROL) 1.25 MG (50000 UNIT) PO CAPS: 50000.0000 [IU] | ORAL_CAPSULE | ORAL | 0 refills | Status: DC

## 2019-06-23 MED ORDER — FEXOFENADINE HCL 180 MG PO TABS: 180.0000 mg | ORAL_TABLET | Freq: Every day | ORAL | 3 refills | Status: DC

## 2019-06-23 MED ORDER — METFORMIN HCL ER 500 MG PO TB24: ORAL_TABLET | ORAL | 3 refills | Status: DC

## 2019-06-23 MED ORDER — MOMETASONE FUROATE 50 MCG/ACT NA SUSP: 2.0000 | Freq: Every day | NASAL | 11 refills | Status: DC

## 2019-06-23 NOTE — Patient Instructions (Signed)
You had the Tdap (tetanus) shot today  Please continue all other medications as before, and refills have been done if requested.  Please have the pharmacy call with any other refills you may need.  Please continue your efforts at being more active, low cholesterol diet, and weight control.  You are otherwise up to date with prevention measures today.  Please keep your appointments with your specialists as you may have planned  Please go to the LAB in the Basement (turn left off the elevator) for the tests to be done today  You will be contacted by phone if any changes need to be made immediately.  Otherwise, you will receive a letter about your results with an explanation, but please check with MyChart first.  Please remember to sign up for MyChart if you have not done so, as this will be important to you in the future with finding out test results, communicating by private email, and scheduling acute appointments online when needed.  Please return in 6 months, or sooner if needed, with Lab testing done 3-5 days before  

## 2019-06-23 NOTE — Progress Notes (Signed)
Subjective:    Patient ID: [name], male    DOB: 11-13-1966, 53 y.o.   MRN: [id number]  HPI  Here for wellness and f/u;  Overall doing ok;  Pt denies Chest pain, worsening SOB, DOE, wheezing, orthopnea, PND, worsening LE edema, palpitations, dizziness or syncope.  Pt denies neurological change such as new headache, facial or extremity weakness.  Pt denies polydipsia, polyuria, or low sugar symptoms. Pt states overall good compliance with treatment and medications, good tolerability, and has been trying to follow appropriate diet.  Pt denies worsening depressive symptoms, suicidal ideation or panic. No fever, night sweats, wt loss, loss of appetite, or other constitutional symptoms.  Pt states good ability with ADL's, has low fall risk, home safety reviewed and adequate, no other significant changes in hearing or vision, and only occasionally active with exercise.  Lost wt with better diet Wt Readings from Last 3 Encounters:  06/23/19 213 lb (96.6 kg)  05/01/18 225 lb (102.1 kg)  02/16/18 229 lb (103.9 kg)   BP Readings from Last 3 Encounters:  06/23/19 (!) 144/96  05/01/18 (!) 150/92  02/16/18 (!) 156/80   Past Medical History:  Diagnosis Date  . ALLERGIC RHINITIS 01/22/2008  . EPIDIDYMITIS 02/03/2009  . ERECTILE DYSFUNCTION 08/06/2007  . GERD 01/22/2008  . GOUT 01/22/2008  . 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 HYPERCHOLESTEROLEMIA 08/06/2007  . HYPERLIPIDEMIA 08/15/2007  . HYPERTENSION 08/15/2007  . NECK SPRAIN AND STRAIN 09/22/2009  . 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 SKIN LESION 09/22/2009  . Thoracic back pain    Past Surgical History:  Procedure Laterality Date  . left labrum tear surgury mar 2013    . PERCUTANEOUS LIVER BIOPSY  2005  . s/p septal nasal repair    . s/p sinus polyp removed      reports that he has never smoked. He has never used smokeless tobacco. He reports current alcohol use. He reports that he does not use drugs. family history  includes Cancer in his maternal grandfather and paternal grandmother; Colon cancer in his sister; Colon polyps in his father and sister; Crohn's disease in his sister; Diabetes in his mother; Heart disease in his maternal grandmother; Heart failure in his mother; Hyperlipidemia in his mother; Hypertension in his mother; Stroke in his father and paternal grandmother. Allergies  Allergen Reactions  . Amlodipine Besylate Other (See Comments)    unknown  . Codeine Other (See Comments)    unknown  . Crestor [Rosuvastatin Calcium] Other (See Comments)    myalgia  . Lovastatin Other (See Comments)    myalgia  . Penicillins Other (See Comments)    unknown   Current Outpatient Medications on File Prior to Visit  Medication Sig Dispense Refill  . aspirin EC 81 MG tablet Take 1 tablet (81 mg total) by mouth daily. 90 tablet 11  . Multiple Vitamin (MULTIVITAMIN WITH MINERALS) TABS tablet Take 1 tablet by mouth daily.    . ursodiol (ACTIGALL) 500 MG tablet Take 1-2 tablets (500-1,000 mg total) by mouth 2 (two) times daily. Take 2 tablets in the am and 1 tablet in the pm 270 tablet 3   No current facility-administered medications on file prior to visit.    Review of Systems Constitutional: Negative for other unusual diaphoresis, sweats, appetite or weight changes HENT: Negative for other worsening hearing loss, ear pain, facial swelling, mouth sores or neck stiffness.   Eyes: Negative for other worsening pain, redness or other visual disturbance.  Respiratory:  Negative for other stridor or swelling Cardiovascular: Negative for other palpitations or other chest pain  Gastrointestinal: Negative for worsening diarrhea or loose stools, blood in stool, distention or other pain Genitourinary: Negative for hematuria, flank pain or other change in urine volume.  Musculoskeletal: Negative for myalgias or other joint swelling.  Skin: Negative for other color change, or other wound or worsening drainage.   Neurological: Negative for other syncope or numbness. Hematological: Negative for other adenopathy or swelling Psychiatric/Behavioral: Negative for hallucinations, other worsening agitation, SI, self-injury, or new decreased concentration All other system neg per pt    Objective:   Physical Exam BP (!) 144/96   Pulse 74   Temp 98.5 F (36.9 C) (Oral)   Ht 5\' 9"  (1.753 m)   Wt 213 lb (96.6 kg)   SpO2 98%   BMI 31.45 kg/m  VS noted,  Constitutional: Pt is oriented to person, place, and time. Appears well-developed and well-nourished, in no significant distress and comfortable Head: Normocephalic and atraumatic  Eyes: Conjunctivae and EOM are normal. Pupils are equal, round, and reactive to light Right Ear: External ear normal without discharge Left Ear: External ear normal without discharge Nose: Nose without discharge or deformity Mouth/Throat: Oropharynx is without other ulcerations and moist  Neck: Normal range of motion. Neck supple. No JVD present. No tracheal deviation present or significant neck LA or mass Cardiovascular: Normal rate, regular rhythm, normal heart sounds and intact distal pulses.   Pulmonary/Chest: WOB normal and breath sounds without rales or wheezing  Abdominal: Soft. Bowel sounds are normal. NT. No HSM  Musculoskeletal: Normal range of motion. Exhibits no edema Lymphadenopathy: Has no other cervical adenopathy.  Neurological: Pt is alert and oriented to person, place, and time. Pt has normal reflexes. No cranial nerve deficit. Motor grossly intact, Gait intact Skin: Skin is warm and dry. No rash noted or new ulcerations Psychiatric:  Has normal mood and affect. Behavior is normal without agitation No other exam findings Lab Results  Component Value Date   WBC 5.3 06/23/2019   HGB 14.4 06/23/2019   HCT 43.1 06/23/2019   PLT 159.0 06/23/2019   GLUCOSE 81 06/23/2019   CHOL 251 (H) 06/23/2019   TRIG 134.0 06/23/2019   HDL 53.00 06/23/2019   LDLDIRECT  175.0 05/01/2018   LDLCALC 171 (H) 06/23/2019   ALT 53 06/23/2019   AST 40 (H) 06/23/2019   NA 137 06/23/2019   K 3.7 06/23/2019   CL 98 06/23/2019   CREATININE 0.77 06/23/2019   BUN 8 06/23/2019   CO2 31 06/23/2019   TSH 1.35 06/23/2019   PSA 0.55 06/23/2019   INR 1.08 08/26/2014   HGBA1C 6.7 (H) 06/23/2019   MICROALBUR <0.7 06/23/2019       Assessment & Plan:

## 2019-06-24 ENCOUNTER — Telehealth: Payer: Self-pay

## 2019-06-24 NOTE — Telephone Encounter (Signed)
Pt has viewed results via MyChart  

## 2019-06-24 NOTE — Telephone Encounter (Signed)
-----   Message from Biagio Borg, MD sent at 06/23/2019  6:55 PM EDT ----- Left message on MyChart, pt to cont same tx except  The test results show that your current treatment is OK, except the Vitamin D level is low.  Please take Vitamin D 50000 units weekly for 12 weeks, then plan to change to OTC Vitamin D3 at 2000 units per day, indefinitely.  Shirron to please inform pt, I will do rx

## 2019-06-27 ENCOUNTER — Encounter: Payer: Self-pay | Admitting: Internal Medicine

## 2019-06-27 NOTE — Assessment & Plan Note (Signed)
Uncontrolled, but very resistant to med change, cont same tx

## 2019-06-27 NOTE — Assessment & Plan Note (Signed)
stable overall by history and exam, recent data reviewed with pt, and pt to continue medical treatment as before,  to f/u any worsening symptoms or concerns  

## 2019-06-27 NOTE — Assessment & Plan Note (Signed)

## 2019-06-27 NOTE — Assessment & Plan Note (Signed)
To cont nasacort

## 2019-08-09 ENCOUNTER — Encounter: Payer: Self-pay | Admitting: Internal Medicine

## 2019-08-25 DIAGNOSIS — Z23 Encounter for immunization: Secondary | ICD-10-CM | POA: Diagnosis not present

## 2019-09-18 ENCOUNTER — Other Ambulatory Visit: Payer: Self-pay | Admitting: Internal Medicine

## 2019-09-29 ENCOUNTER — Encounter: Payer: Self-pay | Admitting: Internal Medicine

## 2019-10-15 DIAGNOSIS — H524 Presbyopia: Secondary | ICD-10-CM | POA: Diagnosis not present

## 2019-10-15 DIAGNOSIS — E119 Type 2 diabetes mellitus without complications: Secondary | ICD-10-CM | POA: Diagnosis not present

## 2019-10-15 DIAGNOSIS — H52223 Regular astigmatism, bilateral: Secondary | ICD-10-CM | POA: Diagnosis not present

## 2019-10-15 DIAGNOSIS — H5213 Myopia, bilateral: Secondary | ICD-10-CM | POA: Diagnosis not present

## 2019-10-15 LAB — HM DIABETES EYE EXAM

## 2019-11-24 ENCOUNTER — Other Ambulatory Visit: Payer: Self-pay

## 2019-11-24 ENCOUNTER — Encounter: Payer: Self-pay | Admitting: Internal Medicine

## 2019-11-24 ENCOUNTER — Ambulatory Visit (AMBULATORY_SURGERY_CENTER): Payer: BC Managed Care – PPO | Admitting: *Deleted

## 2019-11-24 VITALS — Temp 96.9°F | Ht 69.0 in | Wt 212.0 lb

## 2019-11-24 DIAGNOSIS — Z86010 Personal history of colonic polyps: Secondary | ICD-10-CM

## 2019-11-24 DIAGNOSIS — Z1159 Encounter for screening for other viral diseases: Secondary | ICD-10-CM

## 2019-11-24 NOTE — Progress Notes (Signed)

## 2019-12-06 ENCOUNTER — Other Ambulatory Visit: Payer: Self-pay | Admitting: Internal Medicine

## 2019-12-06 ENCOUNTER — Ambulatory Visit (INDEPENDENT_AMBULATORY_CARE_PROVIDER_SITE_OTHER): Payer: BC Managed Care – PPO

## 2019-12-06 DIAGNOSIS — Z1159 Encounter for screening for other viral diseases: Secondary | ICD-10-CM | POA: Diagnosis not present

## 2019-12-07 LAB — SARS CORONAVIRUS 2 (TAT 6-24 HRS): SARS Coronavirus 2: NEGATIVE

## 2019-12-08 ENCOUNTER — Other Ambulatory Visit: Payer: Self-pay

## 2019-12-08 ENCOUNTER — Other Ambulatory Visit (INDEPENDENT_AMBULATORY_CARE_PROVIDER_SITE_OTHER): Payer: BC Managed Care – PPO

## 2019-12-08 ENCOUNTER — Encounter: Payer: Self-pay | Admitting: Internal Medicine

## 2019-12-08 ENCOUNTER — Ambulatory Visit (AMBULATORY_SURGERY_CENTER): Payer: BC Managed Care – PPO | Admitting: Internal Medicine

## 2019-12-08 VITALS — BP 160/88 | HR 75 | Temp 98.5°F | Resp 22 | Ht 69.0 in | Wt 212.0 lb

## 2019-12-08 DIAGNOSIS — Z86010 Personal history of colonic polyps: Secondary | ICD-10-CM

## 2019-12-08 DIAGNOSIS — K743 Primary biliary cirrhosis: Secondary | ICD-10-CM

## 2019-12-08 LAB — CBC
HCT: 42.2 % (ref 39.0–52.0)
Hemoglobin: 14.4 g/dL (ref 13.0–17.0)
MCHC: 34 g/dL (ref 30.0–36.0)
MCV: 88.8 fl (ref 78.0–100.0)
Platelets: 143 10*3/uL — ABNORMAL LOW (ref 150.0–400.0)
RBC: 4.75 Mil/uL (ref 4.22–5.81)
RDW: 14 % (ref 11.5–15.5)
WBC: 3.1 10*3/uL — ABNORMAL LOW (ref 4.0–10.5)

## 2019-12-08 LAB — HEPATIC FUNCTION PANEL
ALT: 69 U/L — ABNORMAL HIGH (ref 0–53)
AST: 47 U/L — ABNORMAL HIGH (ref 0–37)
Albumin: 4.1 g/dL (ref 3.5–5.2)
Alkaline Phosphatase: 114 U/L (ref 39–117)
Bilirubin, Direct: 0.5 mg/dL — ABNORMAL HIGH (ref 0.0–0.3)
Total Bilirubin: 1.2 mg/dL (ref 0.2–1.2)
Total Protein: 7.9 g/dL (ref 6.0–8.3)

## 2019-12-08 LAB — PROTIME-INR
INR: 1.3 ratio — ABNORMAL HIGH (ref 0.8–1.0)
Prothrombin Time: 15 s — ABNORMAL HIGH (ref 9.6–13.1)

## 2019-12-08 MED ORDER — SODIUM CHLORIDE 0.9 % IV SOLN: 500.0000 mL | Freq: Once | INTRAVENOUS | Status: DC

## 2019-12-08 NOTE — Op Note (Signed)
[facility] Patient Name: [name] Procedure Date: 12/08/2019 10:42 AM MRN: [id number] Endoscopist: Gatha Mayer , MD Age: 52 Referring MD:  Date of Birth: May 11, 1966 Gender: Male Account #: [id number] Procedure:                Colonoscopy Indications:              Surveillance: Personal history of adenomatous                            polyps on last colonoscopy 5 years ago Medicines:                Propofol per Anesthesia, Monitored Anesthesia Care Procedure:                Pre-Anesthesia Assessment:                           - Prior to the procedure, a History and Physical                            was performed, and patient medications and                            allergies were reviewed. The patient's tolerance of                            previous anesthesia was also reviewed. The risks                            and benefits of the procedure and the sedation                            options and risks were discussed with the patient.                            All questions were answered, and informed consent                            was obtained. Prior Anticoagulants: The patient has                            taken no previous anticoagulant or antiplatelet                            agents. ASA Grade Assessment: II - A patient with                            mild systemic disease. After reviewing the risks                            and benefits, the patient was deemed in                            satisfactory condition to undergo the procedure.  After obtaining informed consent, the colonoscope                            was passed under direct vision. Throughout the                            procedure, the patient's blood pressure, pulse, and                            oxygen saturations were monitored continuously. The                            Colonoscope was introduced through the anus and   advanced to the the cecum, identified by                            appendiceal orifice and ileocecal valve. The                            colonoscopy was performed without difficulty. The                            patient tolerated the procedure well. The quality                            of the bowel preparation was excellent. The bowel                            preparation used was Miralax via split dose                            instruction. The ileocecal valve, appendiceal                            orifice, and rectum were photographed. Scope In: 10:54:21 AM Scope Out: 11:08:21 AM Scope Withdrawal Time: 0 hours 9 minutes 4 seconds  Total Procedure Duration: 0 hours 14 minutes 0 seconds  Findings:                 The perianal and digital rectal examinations were                            normal. Pertinent negatives include normal prostate                            (size, shape, and consistency).                           External and internal hemorrhoids were found.                           Small rectal varices were found.                           Multiple diverticula were found in the sigmoid  colon.                           The exam was otherwise without abnormality on                            direct and retroflexion views. Complications:            No immediate complications. Estimated Blood Loss:     Estimated blood loss: none. Impression:               - External and internal hemorrhoids.                           - Rectal varices.                           - Diverticulosis in the sigmoid colon.                           - The examination was otherwise normal on direct                            and retroflexion views.                           - No specimens collected. Recommendation:           - Patient has a contact number available for                            emergencies. The signs and symptoms of potential                             delayed complications were discussed with the                            patient. Return to normal activities tomorrow.                            Written discharge instructions were provided to the                            patient.                           - Resume previous diet.                           - Continue present medications.                           - Repeat colonoscopy in 10 years.                           - To lab today for CBC, LFT and INR to f/u PBC Gatha Mayer, MD 12/08/2019 11:24:11 AM This report has been signed electronically.

## 2019-12-08 NOTE — Progress Notes (Signed)
PT taken to PACU. Monitors in place. VSS. Report given to RN. 

## 2019-12-08 NOTE — Patient Instructions (Addendum)
No polyps today - repeat exam 2030.  Please go to the lab and we will check on the liver today and I will call with next steps. Should get an ultrasound set up I think - will arrange after lab review.  I appreciate the opportunity to care for you. Gatha Mayer, MD, FACG  YOU HAD AN ENDOSCOPIC PROCEDURE TODAY AT [facility]:   Refer to the procedure report that was given to you for any specific questions about what was found during the examination.  If the procedure report does not answer your questions, please call your gastroenterologist to clarify.  If you requested that your care partner not be given the details of your procedure findings, then the procedure report has been included in a sealed envelope for you to review at your convenience later.  YOU SHOULD EXPECT: Some feelings of bloating in the abdomen. Passage of more gas than usual.  Walking can help get rid of the air that was put into your GI tract during the procedure and reduce the bloating. If you had a lower endoscopy (such as a colonoscopy or flexible sigmoidoscopy) you may notice spotting of blood in your stool or on the toilet paper. If you underwent a bowel prep for your procedure, you may not have a normal bowel movement for a few days.  Please Note:  You might notice some irritation and congestion in your nose or some drainage.  This is from the oxygen used during your procedure.  There is no need for concern and it should clear up in a day or so.  SYMPTOMS TO REPORT IMMEDIATELY:   Following lower endoscopy (colonoscopy or flexible sigmoidoscopy):  Excessive amounts of blood in the stool  Significant tenderness or worsening of abdominal pains  Swelling of the abdomen that is new, acute  Fever of 100F or higher    For urgent or emergent issues, a gastroenterologist can be reached at any hour by calling [phone].   DIET:  We do recommend a small meal at first, but then you may proceed to your  regular diet.  Drink plenty of fluids but you should avoid alcoholic beverages for 24 hours.  ACTIVITY:  You should plan to take it easy for the rest of today and you should NOT DRIVE or use heavy machinery until tomorrow (because of the sedation medicines used during the test).    FOLLOW UP: Our staff will call the number listed on your records 48-72 hours following your procedure to check on you and address any questions or concerns that you may have regarding the information given to you following your procedure. If we do not reach you, we will leave a message.  We will attempt to reach you two times.  During this call, we will ask if you have developed any symptoms of COVID 19. If you develop any symptoms (ie: fever, flu-like symptoms, shortness of breath, cough etc.) before then, please call [phone].  If you test positive for Covid 19 in the 2 weeks post procedure, please call and report this information to [place].    If any biopsies were taken you will be contacted by phone or by letter within the next 1-3 weeks.  Please call us at [phone] if you have not heard about the biopsies in 3 weeks.    SIGNATURES/CONFIDENTIALITY: You and/or your care partner have signed paperwork which will be entered into your electronic medical record.  These signatures attest to the fact that  that the information above on your After Visit Summary has been reviewed and is understood.  Full responsibility of the confidentiality of this discharge information lies with you and/or your care-partner.

## 2019-12-08 NOTE — Progress Notes (Signed)
Temp by JB, Vitals by DT   Pt's states no medical or surgical changes since previsit or office visit. 

## 2019-12-13 ENCOUNTER — Telehealth: Payer: Self-pay

## 2019-12-13 NOTE — Telephone Encounter (Signed)
  Follow up Call-  Call back number 12/08/2019  Post procedure Call Back phone  # [phone]  Permission to leave phone message Yes  Some recent data might be hidden     Patient questions:  Do you have a fever, pain , or abdominal swelling? No. Pain Score  0 *  Have you tolerated food without any problems? Yes.    Have you been able to return to your normal activities? Yes.    Do you have any questions about your discharge instructions: Diet   No. Medications  No. Follow up visit  No.  Do you have questions or concerns about your Care? No.  Actions: * If pain score is 4 or above: No action needed, pain <4.  1. Have you developed a fever since your procedure? no  2.   Have you had an respiratory symptoms (SOB or cough) since your procedure? no  3.   Have you tested positive for COVID 19 since your procedure no  4.   Have you had any family members/close contacts diagnosed with the COVID 19 since your procedure?  no   If yes to any of these questions please route to Joylene [name], RN and Alphonsa Gin, Therapist, sports.

## 2019-12-14 ENCOUNTER — Other Ambulatory Visit: Payer: Self-pay

## 2019-12-14 DIAGNOSIS — R7989 Other specified abnormal findings of blood chemistry: Secondary | ICD-10-CM

## 2019-12-14 DIAGNOSIS — K743 Primary biliary cirrhosis: Secondary | ICD-10-CM

## 2019-12-22 ENCOUNTER — Ambulatory Visit (HOSPITAL_COMMUNITY): Payer: BC Managed Care – PPO

## 2019-12-24 ENCOUNTER — Ambulatory Visit (HOSPITAL_COMMUNITY)
Admission: RE | Admit: 2019-12-24 | Discharge: 2019-12-24 | Disposition: A | Discharge status: Home / Self Care | Payer: BC Managed Care – PPO | Source: Ambulatory Visit | Attending: Internal Medicine | Admitting: Internal Medicine

## 2019-12-24 ENCOUNTER — Other Ambulatory Visit: Payer: Self-pay

## 2019-12-24 DIAGNOSIS — R945 Abnormal results of liver function studies: Secondary | ICD-10-CM | POA: Diagnosis not present

## 2019-12-24 DIAGNOSIS — R7989 Other specified abnormal findings of blood chemistry: Secondary | ICD-10-CM | POA: Diagnosis not present

## 2019-12-24 DIAGNOSIS — K743 Primary biliary cirrhosis: Secondary | ICD-10-CM | POA: Insufficient documentation

## 2019-12-24 DIAGNOSIS — R748 Abnormal levels of other serum enzymes: Secondary | ICD-10-CM | POA: Diagnosis not present

## 2019-12-28 ENCOUNTER — Ambulatory Visit: Payer: BC Managed Care – PPO | Admitting: Internal Medicine

## 2020-01-25 ENCOUNTER — Encounter: Payer: Self-pay | Admitting: Internal Medicine

## 2020-01-26 ENCOUNTER — Ambulatory Visit: Payer: BC Managed Care – PPO | Admitting: Internal Medicine

## 2020-01-28 ENCOUNTER — Other Ambulatory Visit: Payer: Self-pay

## 2020-01-28 ENCOUNTER — Encounter: Payer: Self-pay | Admitting: Internal Medicine

## 2020-01-28 ENCOUNTER — Other Ambulatory Visit (INDEPENDENT_AMBULATORY_CARE_PROVIDER_SITE_OTHER): Payer: BC Managed Care – PPO

## 2020-01-28 ENCOUNTER — Ambulatory Visit: Payer: BC Managed Care – PPO | Admitting: Internal Medicine

## 2020-01-28 VITALS — BP 160/80 | HR 99 | Temp 97.8°F | Ht 69.0 in | Wt 212.0 lb

## 2020-01-28 DIAGNOSIS — K745 Biliary cirrhosis, unspecified: Secondary | ICD-10-CM

## 2020-01-28 DIAGNOSIS — K743 Primary biliary cirrhosis: Secondary | ICD-10-CM | POA: Diagnosis not present

## 2020-01-28 DIAGNOSIS — Z01818 Encounter for other preprocedural examination: Secondary | ICD-10-CM

## 2020-01-28 DIAGNOSIS — Z23 Encounter for immunization: Secondary | ICD-10-CM | POA: Diagnosis not present

## 2020-01-28 LAB — VITAMIN D 25 HYDROXY (VIT D DEFICIENCY, FRACTURES): VITD: 25.59 ng/mL — ABNORMAL LOW (ref 30.00–100.00)

## 2020-01-28 MED ORDER — URSODIOL 300 MG PO CAPS: 600.0000 mg | ORAL_CAPSULE | Freq: Two times a day (BID) | ORAL | 1 refills | Status: DC

## 2020-01-28 NOTE — Assessment & Plan Note (Addendum)
Restart ursodiol 600 mg bid (this is close to 15 mg/kg) EGD - screen for varices Pneumovax  I think repeat liver bx likely appropriate but think hepatology input would be helpful - due to pain after last liver bx he is reluctant - plus he is moving to [place] soon -   I have explained what we think he has and how it can progress to liver failure and that to best of our knowledge ursodiol helps reduce that   [place] handout provided  Plan is for him to establish with a hepatologist in [place] when he moved later this year  He has been immunized for HAV and HBV Hold on DEXA - would make sense to be done where can be followed Am  checking fat soluble vitamins - AED

## 2020-01-28 NOTE — Patient Instructions (Addendum)
You have been scheduled for an endoscopy. Please follow written instructions given to you at your visit today. If you use inhalers (even only as needed), please bring them with you on the day of your procedure.   You have been given a pneumovax 23 vaccine today.   Your provider has requested that you go to the basement level for lab work before leaving today. Press "B" on the elevator. The lab is located at the first door on the left as you exit the elevator.   We have sent the following medications to your pharmacy for you to pick up at your convenience: Ursodiol   Please come the first week of April and have your liver function drawn. The lab is open 7:30-5:30pm and close for lunch 1:00pm-1:30pm.   I appreciate the opportunity to care for you. Silvano Rusk, MD, [facility]

## 2020-01-28 NOTE — Progress Notes (Signed)
[name] 54 y.o. 07-01-1966 [id number]  Assessment & Plan:   Encounter Diagnoses  Name Primary?  . 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 Restart ursodiol 600 mg bid (this is close to 15 mg/kg) EGD - screen for varices Pneumovax  I think repeat liver bx likely appropriate but think hepatology input would be helpful - due to pain after last liver bx he is reluctant - plus he is moving to [place] soon -   I have explained what we think he has and how it can progress to liver failure and that to best of our knowledge ursodiol helps reduce that   [facility] Crockett handout provided  Plan is for him to establish with a hepatologist in [place] when he moved later this year  He has been immunized for HAV and HBV Hold on DEXA - would make sense to be done where can be followed Am  checking fat soluble vitamins - AED  [id number], Hunt Oris, MD   Subjective:   Chief Complaint: PBC  HPI 54 year old African-American man with a history of seronegative primary biliary cholangitis diagnosed around 2005 and previously seen at [facility] (see below), came to me for a colonoscopy recently and I discovered he had been off his ursodiol for a number of years.  I had him do an ultrasound and labs.  Lab Results  Component Value Date   ALT 69 (H) 12/08/2019   AST 47 (H) 12/08/2019   ALKPHOS 114 12/08/2019   BILITOT 1.2 12/08/2019    Lab Results  Component Value Date   WBC 3.1 (L) 12/08/2019   HGB 14.4 12/08/2019   HCT 42.2 12/08/2019   MCV 88.8 12/08/2019   PLT 143.0 (L) 12/08/2019   Lab Results  Component Value Date   INR 1.3 (H) 12/08/2019   INR 1.08 08/26/2014   INR 1.2 (H) 05/20/2014   Complete abdominal ultrasound on 12/24/2019 showed liver with a nodular contour was somewhat coarsened  echotexture, "findings indicative of a degree of hepatic cirrhosis" no focal liver lesions.  No ascites reported.  He feels okay no symptoms.  He does not drink alcohol.  He is planning to move to [place] soon once he sells his house here as his children are all living and working or going to school in [place].  He is willing to go back on therapy.  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 He was last seen in hepatology clinic in 2011. Per previous note, he has had abnormal liver enzymes since approximately 1994, but he has been completely asymptomatic. A liver biopsy performed in March of 2005 revealed granulomatous hepatitis with grade II inflammation and stage 0-1 fibrosis. 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 The granulomas were not the type seen in sarcoid nor does the patient have any evidence of systemic sarcoid. [name] has a normal ACE level, negative chest CT scan from an OSH and no evidence of organomegaly on abdominal imaging. He has responded quite well to ursodiol. He is currently on 15 mg/kg per day. He has had a near normalization of his liver enzymes on ursodiol. He recently underwent  an abd sonogram on 08/12/2014 which showed a mildly nodular contoured liver consistent for cirrhosis. Spleen was normal in size. He underwent a repeat liver biopsy on 08/26/14 which demonstrated chronic hepatitis, etiology undetermined, grade 2, stage 0-1 with scattered small, poorly-formed portal and lobular granulomas. He had a colonoscopy done on 08/11/2014 which showed diverticulosis and ?small rectal varices. He has not had an EGD. He shows no immunity to hep A or B.  [name] is a pleasant 54 year old African-American with a history of AMA negative PBC. He was last seen  in hepatology clinic in 2011. Per previous note, The patient has had abnormal liver enzymes since approximately 1994, but he has been completely asymptomatic. A liver biopsy performed in March of 2005 had revealed granulomatous hepatitis with grade II inflammation and stage 0-1 fibrosis. 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 The granulomas were not the type seen in sarcoid nor does the patient have any evidence of systemic sarcoid despite his ethnicity. [name] has a normal ACE level, negative chest CT scan from an OSH and no evidence of organomegaly on abdominal imaging. He has responded quite well to ursodiol. He is currently on approximately 15 mg/kg per day. He has had a near normalization of his liver enzymes on ursodiol. He recently underwent an abd sonogram on 08/12/2014 which showed a mildly nodular contoured liver consistent for cirrhosis. Spleen was normal in size. He subsequently underwent a repeat liver biopsy on 08/26/14 which demonstrated chronic hepatitis, etiology undetermined, grade 2, stage 0-1 with scattered small, poorly-formed portal and lobular granulomas. He had a colonoscopy done on 08/11/2014 which showed diverticulosis and ?small rectal varices. He has not had an EGD. He shows no immunity to hep A or B.     08/26/2014 Diagnosis Liver, needle/core biopsy, right lobe - CHRONIC ACTIVE HEPATITIS WITH GRANULOMAS. - SEE MICROSCOPIC DESCRIPTION. Microscopic Comment Most of the portal areas are expanded by inflammatory infiltrate consisting of predominately lymphocytes with occasional plasma cells. There are also numerous non necrotizing predominately portal granulomas present. Some of the portal areas are also expanded by proliferating bile ducts and there are a few mildly inflamed bile ducts. Trichrome and reticulin stains show focal periductal fibrosis with no features of  psoriasis. No increased iron is identified with iron stain. The clinical suspicion of primary biliary cirrhosis is noted and the differential based on the presence of granulomas certainly includes PBC and clinical and serologic correlation especially antimitochondrial antibody antibody is suggested. Other caused of granulomatous hepatitis are also in the differential. Called to Dr. Carlean Purl on 08/29/14. (JDP;gt, 08/29/14)     [facility] - (Outside case, [phone], 3 slides, 02/17/04)  Liver, needle core biopsy - Chronic hepatitis, etiology undetermined, grade 2, stage 0-1 with scattered small, poorly-formed portal and lobular granulomas.  Comment: Sections show a periportal and lobular infiltrate of lymphocytes and histiocytes. Plasma cells are uncommon. There is focal lymphocyte-mediated bile duct damage. although characteristic florid duct lesions are not identified. Small, poorly-formed granulomas are scattered within lobular and portal areas. An iron stain shows no increase intraparenchymal iron deposition. A trichrome stain shows focal periportal fibrosis. The above findings are most consistent with early PBC or a drug-induced hepatitic process. Sarcoid is not favored as a diagnosis, since granulomas in sarcoid are typically larger and often fused, located in a primarily portal distribution, and are not associated with a hepatitic background.  negative hepatitis B and C serologies, and clinically suspected non-alcoholic steatohepatitis (BMI of 33.4) who underwent a liver biopsy on February 17, 2004, with findings interpreted as "granulomatous hepatitis with inflamed bile ducts." antimitochondrial antibodies are negative, antinuclear antibodies negative, ACE level is negative, ANCA is negative, alpha-1 antitrypsin levels are normal, ceruloplasmin levels are normal, antismooth muscle antibody is negative, an alcohol/toxicology screen is negative, and hepatic imaging is  negative. Ferritin 233  1998 HCV Ab neg, HBSAg, Ab, Core Ab IgM and total negative, Hep B surface Ab negative,   1997 dx abnl TA's w/ AST 58, ALT 117 Alk phos 162 T bili 1     Allergies  Allergen Reactions  . Amlodipine Besylate Other (See Comments)    unknown  . Codeine Other (See Comments)    unknown  . Crestor [Rosuvastatin Calcium] Other (See Comments)    myalgia  . Lovastatin Other (See Comments)    myalgia  . Penicillins Other (See Comments)    unknown   Current Meds  Medication Sig  . aspirin EC 81 MG tablet Take 1 tablet (81 mg total) by mouth daily.  Marland Kitchen diltiazem (CARDIZEM CD) 300 MG 24 hr capsule Take 1 capsule (300 mg total) by mouth daily.  . fexofenadine (ALLEGRA) 180 MG tablet Take 1 tablet (180 mg total) by mouth daily.  . metFORMIN (GLUCOPHAGE-XR) 500 MG 24 hr tablet TAKE 2 TABLETS BY MOUTH DAILY WITH BREAKFAST. ANNUAL APPT IS DUE MUST SEE PROVIDER FOR FUTURE REFILLS  . mometasone (NASONEX) 50 MCG/ACT nasal spray Place 2 sprays into the nose daily.  . Multiple Vitamin (MULTIVITAMIN WITH MINERALS) TABS tablet Take 1 tablet by mouth daily.  Marland Kitchen olmesartan-hydrochlorothiazide (BENICAR HCT) 40-12.5 MG tablet Take 1 tablet by mouth daily.   Past Medical History:  Diagnosis Date  . Adenomatous polyp of colon-diminutive 08/2014  . ALLERGIC RHINITIS 01/22/2008  . Allergy   . Arthritis    shoulder and left hand   . Diabetes mellitus without complication ([place])    pre DM- on metformin   . EPIDIDYMITIS 02/03/2009  . ERECTILE DYSFUNCTION 08/06/2007  . GERD 01/22/2008  . GOUT 01/22/2008  . 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 HYPERCHOLESTEROLEMIA 08/06/2007  . HYPERLIPIDEMIA 08/15/2007  . HYPERTENSION 08/15/2007  . NECK SPRAIN AND STRAIN 09/22/2009  . Primary biliary cholangitis (HCC)-seronegative, w/ suspected cirrhosis 02/17/2004    08/26/2014 Diagnosis Liver, needle/core biopsy, right lobe - CHRONIC ACTIVE HEPATITIS WITH GRANULOMAS. - SEE MICROSCOPIC DESCRIPTION.  Microscopic Comment Most of the portal areas are expanded by inflammatory infiltrate consisting of predominately lymphocytes with occasional plasma cells. There are also numerous non necrotizing predominately portal granulomas present. Some of the portal areas are a  . SKIN LESION 09/22/2009  . Thoracic back pain    Past Surgical History:  Procedure Laterality Date  . COLONOSCOPY     multple  polyps and Fhx  . left labrum tear surgury mar 2013     shoulder  . PERCUTANEOUS LIVER BIOPSY  2015   2005 also  . s/p septal nasal repair    . 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   Right-handed.   family history includes Cancer in his maternal grandfather and paternal grandmother; Colon cancer (age of onset: 70) in his sister; Colon polyps in his father and sister; Crohn's disease in his  sister; Diabetes in his mother; Heart disease in his maternal grandmother; Heart failure in his mother; Hyperlipidemia in his mother; Hypertension in his mother; Stroke in his father and paternal grandmother.   Review of Systems As above  Objective:   Physical Exam BP (!) 160/80   Pulse 99   Temp 97.8 F (36.6 C)   Ht '5\' 9"'  (1.753 m)   Wt 212 lb (96.2 kg)   BMI 31.31 kg/m

## 2020-01-31 LAB — VITAMIN E
Gamma-Tocopherol (Vit E): 1 mg/L (ref <=4.3)
Vitamin E (Alpha Tocopherol): 11.5 mg/L (ref 5.7–19.9)

## 2020-01-31 LAB — VITAMIN A: Vitamin A (Retinoic Acid): 29 ug/dL — ABNORMAL LOW (ref 38–98)

## 2020-02-04 ENCOUNTER — Other Ambulatory Visit: Payer: Self-pay | Admitting: *Deleted

## 2020-02-04 MED ORDER — VITAMIN D (ERGOCALCIFEROL) 1.25 MG (50000 UNIT) PO CAPS: 50000.0000 [IU] | ORAL_CAPSULE | ORAL | 0 refills | Status: AC

## 2020-02-04 MED ORDER — VITAMIN A 7.5 MG (25000 UT) PO CAPS: ORAL_CAPSULE | ORAL | 0 refills | Status: DC

## 2020-02-07 ENCOUNTER — Other Ambulatory Visit: Payer: Self-pay

## 2020-02-07 MED ORDER — URSODIOL 300 MG PO CAPS: 600.0000 mg | ORAL_CAPSULE | Freq: Two times a day (BID) | ORAL | 1 refills | Status: DC

## 2020-02-07 MED ORDER — VITAMIN A 3 MG (10000 UNIT) PO CAPS: ORAL_CAPSULE | ORAL | 0 refills | Status: AC

## 2020-02-07 NOTE — Telephone Encounter (Signed)
I have resent the Vitamin A and ursodiol rx's as approved by Dr Carlean Purl. See [facility] message from 02/06/2020.

## 2020-02-13 ENCOUNTER — Ambulatory Visit: Payer: BC Managed Care – PPO | Attending: Internal Medicine

## 2020-02-13 DIAGNOSIS — Z23 Encounter for immunization: Secondary | ICD-10-CM

## 2020-02-13 NOTE — Progress Notes (Signed)
   Covid-19 Vaccination Clinic  Name:  CUTBERTO RICCA    MRN: [id number] DOB: 1966/01/10  02/13/2020  Mr. Jemmott was observed post Covid-19 immunization for 15 minutes without incident. He was provided with Vaccine Information Sheet and instruction to access the V-Safe system.   Mr. Gronau was instructed to call 911 with any severe reactions post vaccine: Marland Kitchen Difficulty breathing  . Swelling of face and throat  . A fast heartbeat  . A bad rash all over body  . Dizziness and weakness   Immunizations Administered    Name Date Dose VIS Date Route   Pfizer COVID-19 Vaccine 02/13/2020  3:49 PM 0.3 mL 11/19/2019 Intramuscular   Manufacturer: [place]   Lot: [id number]   [place]: [id number]

## 2020-03-02 ENCOUNTER — Encounter: Payer: BC Managed Care – PPO | Admitting: Internal Medicine

## 2020-03-07 ENCOUNTER — Ambulatory Visit: Payer: BC Managed Care – PPO | Attending: Internal Medicine

## 2020-03-07 DIAGNOSIS — Z23 Encounter for immunization: Secondary | ICD-10-CM

## 2020-03-07 NOTE — Progress Notes (Signed)
   Covid-19 Vaccination Clinic  Name:  [name]    MRN: [id number] DOB: 1966-04-25  03/07/2020  [name] was observed post Covid-19 immunization for 15 minutes without incident. He was provided with Vaccine Information Sheet and instruction to access the V-Safe system.   [name] was instructed to call 911 with any severe reactions post vaccine: Marland Kitchen Difficulty breathing  . Swelling of face and throat  . A fast heartbeat  . A bad rash all over body  . Dizziness and weakness   Immunizations Administered    Name Date Dose VIS Date Route   Pfizer COVID-19 Vaccine 03/07/2020  9:02 AM 0.3 mL 11/19/2019 Intramuscular   Manufacturer: [place]   Lot: [id number]   [place]: [id number]

## 2020-03-20 ENCOUNTER — Other Ambulatory Visit: Payer: Self-pay

## 2020-03-20 MED ORDER — URSODIOL 300 MG PO CAPS: 600.0000 mg | ORAL_CAPSULE | Freq: Two times a day (BID) | ORAL | 1 refills | Status: DC

## 2020-04-05 ENCOUNTER — Encounter: Payer: BC Managed Care – PPO | Admitting: Internal Medicine

## 2020-05-09 ENCOUNTER — Encounter: Payer: Self-pay | Admitting: Internal Medicine

## 2020-05-28 ENCOUNTER — Encounter: Payer: Self-pay | Admitting: Internal Medicine

## 2020-05-29 ENCOUNTER — Other Ambulatory Visit: Payer: Self-pay

## 2020-05-29 DIAGNOSIS — Z9109 Other allergy status, other than to drugs and biological substances: Secondary | ICD-10-CM

## 2020-05-29 MED ORDER — OLMESARTAN MEDOXOMIL-HCTZ 40-12.5 MG PO TABS: 1.0000 | ORAL_TABLET | Freq: Every day | ORAL | 0 refills | Status: DC

## 2020-05-29 MED ORDER — DILTIAZEM HCL ER COATED BEADS 300 MG PO CP24: 300.0000 mg | ORAL_CAPSULE | Freq: Every day | ORAL | 0 refills | Status: DC

## 2020-05-29 MED ORDER — METFORMIN HCL ER 500 MG PO TB24: ORAL_TABLET | ORAL | 0 refills | Status: DC

## 2020-05-29 MED ORDER — MOMETASONE FUROATE 50 MCG/ACT NA SUSP: 2.0000 | Freq: Every day | NASAL | 0 refills | Status: DC

## 2020-05-29 MED ORDER — ASPIRIN EC 81 MG PO TBEC: 81.0000 mg | DELAYED_RELEASE_TABLET | Freq: Every day | ORAL | 0 refills | Status: AC

## 2020-05-29 MED ORDER — FEXOFENADINE HCL 180 MG PO TABS: 180.0000 mg | ORAL_TABLET | Freq: Every day | ORAL | 0 refills | Status: DC

## 2020-09-01 ENCOUNTER — Encounter: Payer: Self-pay | Admitting: Internal Medicine

## 2020-09-01 DIAGNOSIS — Z9109 Other allergy status, other than to drugs and biological substances: Secondary | ICD-10-CM

## 2020-09-04 NOTE — Telephone Encounter (Signed)
Ok to let pt know, we can extend another 30 days on routine refills, but unable after that due to office refill policy

## 2020-09-04 NOTE — Telephone Encounter (Signed)
Then of course the meds would need to be sent in

## 2020-09-12 ENCOUNTER — Other Ambulatory Visit: Payer: Self-pay | Admitting: Internal Medicine

## 2020-09-12 NOTE — Telephone Encounter (Signed)
Please refill as per office routine med refill policy (all routine meds refilled for 3 mo or monthly per pt preference up to one year from last visit, then month to month grace period for 3 mo, then further med refills will have to be denied)  

## 2020-09-13 MED ORDER — MOMETASONE FUROATE 50 MCG/ACT NA SUSP: 2.0000 | Freq: Every day | NASAL | 0 refills | Status: AC

## 2020-09-13 MED ORDER — METFORMIN HCL ER 500 MG PO TB24: ORAL_TABLET | ORAL | 0 refills | Status: AC

## 2020-09-13 MED ORDER — DILTIAZEM HCL ER COATED BEADS 300 MG PO CP24: 300.0000 mg | ORAL_CAPSULE | Freq: Every day | ORAL | 0 refills | Status: AC

## 2020-09-13 MED ORDER — FEXOFENADINE HCL 180 MG PO TABS: 180.0000 mg | ORAL_TABLET | Freq: Every day | ORAL | 0 refills | Status: AC

## 2020-09-28 ENCOUNTER — Other Ambulatory Visit: Payer: Self-pay | Admitting: Internal Medicine

## 2020-10-21 ENCOUNTER — Other Ambulatory Visit: Payer: Self-pay | Admitting: Internal Medicine

## 2020-10-22 NOTE — Telephone Encounter (Signed)
Please refill as per office routine med refill policy (all routine meds refilled for 3 mo or monthly per pt preference up to one year from last visit, then month to month grace period for 3 mo, then further med refills will have to be denied)  

## 2021-02-02 ENCOUNTER — Other Ambulatory Visit (INDEPENDENT_AMBULATORY_CARE_PROVIDER_SITE_OTHER): Payer: Self-pay

## 2021-02-02 DIAGNOSIS — R0602 Shortness of breath: Secondary | ICD-10-CM

## 2021-03-13 ENCOUNTER — Ambulatory Visit
Admission: RE | Admit: 2021-03-13 | Discharge: 2021-03-13 | Disposition: A | Discharge status: Home / Self Care | Payer: BC Managed Care – PPO | Source: Ambulatory Visit | Attending: Adult Health | Admitting: Adult Health

## 2021-03-13 DIAGNOSIS — I082 Rheumatic disorders of both aortic and tricuspid valves: Secondary | ICD-10-CM | POA: Insufficient documentation

## 2021-03-13 DIAGNOSIS — R0602 Shortness of breath: Secondary | ICD-10-CM | POA: Insufficient documentation

## 2021-03-14 ENCOUNTER — Ambulatory Visit (INDEPENDENT_AMBULATORY_CARE_PROVIDER_SITE_OTHER): Payer: BC Managed Care – PPO

## 2021-03-15 ENCOUNTER — Encounter (INDEPENDENT_AMBULATORY_CARE_PROVIDER_SITE_OTHER): Payer: Self-pay | Admitting: Cardiology

## 2021-03-15 ENCOUNTER — Encounter (INDEPENDENT_AMBULATORY_CARE_PROVIDER_SITE_OTHER): Payer: Self-pay | Admitting: Cardiovascular Disease

## 2021-03-15 ENCOUNTER — Ambulatory Visit (INDEPENDENT_AMBULATORY_CARE_PROVIDER_SITE_OTHER): Payer: BC Managed Care – PPO | Admitting: Cardiovascular Disease

## 2021-03-15 DIAGNOSIS — R0602 Shortness of breath: Secondary | ICD-10-CM

## 2021-03-15 NOTE — Procedures (Signed)
EXERCISE STRESS TEST    [facility] OFFICE -CARDIOLOGY  [address]  [place] [address]  Dept: [phone]  Dept Fax: [phone]     Patient: Stephen Lutz  Sex: Male   DOB: 05-Aug-1966 (55 y.o.)  MRN:  [id number]     Test Date:  03/15/2021       Interpretation Date: 03/15/2021    Referring Physician: Robyn Haber, NP     CLINICIAN: DFreudenthal  CT  SUPERVISING PROVIDER: Redmond School, MD, [facility]    MEDICATIONS:  Patient has a current medication list which includes the following prescription(s): diltiazem, fexofenadine, hydrochlorothiazide - Take 25 mg by mouth every morning, metformin - Take 1,000 mg by mouth daily, olmesartan-hydrochlorothiazide - Take 1 tablet by mouth daily, and ursodiol.    INDICATION: Dyspnea    GRADED ECG EXERCISE TEST:    ----- Protocol and Exercise Time -----   Protocol Bruce   Exercise Time (min) 09:32   ----- Heart Rate -----   Resting HR 98 bpm   Maximum HR 160 bpm   METS 9.5 METS   Percent Maximum Predicted HR 96 %    -----Blood Pressure -----   Resting BP 160/64mmHg   Maximum BP 228/96 mmHg     Patient Symptoms: None    Reason for end: Fatigue (Safety)    Resting ECG: Sinus Rhythm Incomplete LBBB  ST Changes: 96  Stress ECG: No ischemic changes noted.    Arrhythmias: Rare PVC, ventricular couplet    INTERPRETATION:  1. Hypertensive blood pressure response to exercise.  2. Exercise capacity average for age and gender.  3. No chest pain or ischemic ECG changes with exercise.    CONCLUSIONS:  Normal maximal exercise treadmill test with no clinical or ECG evidence of ischemia.      Results and recommendations discussed with the patient.      INTERPRETED BY: Keitha Butte, MD

## 2021-04-19 ENCOUNTER — Other Ambulatory Visit: Payer: Self-pay | Admitting: Internal Medicine

## 2021-08-11 ENCOUNTER — Ambulatory Visit: Payer: BC Managed Care – PPO

## 2021-12-09 HISTORY — PX: LIVER BIOPSY: SHX301

## 2022-04-24 ENCOUNTER — Other Ambulatory Visit: Payer: Self-pay | Admitting: Gastroenterology

## 2022-04-24 DIAGNOSIS — R7402 Elevation of levels of liver transaminase levels: Secondary | ICD-10-CM

## 2022-04-24 DIAGNOSIS — R7401 Elevation of levels of liver transaminase levels: Secondary | ICD-10-CM

## 2022-05-03 ENCOUNTER — Ambulatory Visit
Admission: RE | Admit: 2022-05-03 | Discharge: 2022-05-03 | Disposition: A | Discharge status: Home / Self Care | Payer: BC Managed Care – PPO | Source: Ambulatory Visit | Attending: Gastroenterology | Admitting: Gastroenterology

## 2022-05-03 DIAGNOSIS — R7402 Elevation of levels of lactic acid dehydrogenase (LDH): Secondary | ICD-10-CM | POA: Insufficient documentation

## 2022-05-03 DIAGNOSIS — R7401 Elevation of levels of liver transaminase levels: Secondary | ICD-10-CM | POA: Insufficient documentation

## 2022-05-08 ENCOUNTER — Other Ambulatory Visit: Payer: Self-pay | Admitting: Transplant Hepatology

## 2022-05-08 DIAGNOSIS — R748 Abnormal levels of other serum enzymes: Secondary | ICD-10-CM

## 2022-05-08 DIAGNOSIS — K753 Granulomatous hepatitis, not elsewhere classified: Secondary | ICD-10-CM

## 2022-05-26 ENCOUNTER — Ambulatory Visit: Payer: BC Managed Care – PPO

## 2022-07-01 ENCOUNTER — Other Ambulatory Visit (INDEPENDENT_AMBULATORY_CARE_PROVIDER_SITE_OTHER): Payer: Self-pay | Admitting: Transplant Hepatology

## 2023-04-28 ENCOUNTER — Emergency Department
Admission: EM | Admit: 2023-04-28 | Discharge: 2023-04-28 | Disposition: A | Discharge status: Home / Self Care | Payer: BC Managed Care – PPO | Attending: Emergency Medical Services | Admitting: Emergency Medical Services

## 2023-04-28 ENCOUNTER — Emergency Department: Payer: BC Managed Care – PPO

## 2023-04-28 DIAGNOSIS — R1013 Epigastric pain: Secondary | ICD-10-CM

## 2023-04-28 HISTORY — DX: Essential (primary) hypertension: I10

## 2023-04-28 HISTORY — DX: Primary biliary cirrhosis: K74.3

## 2023-04-28 HISTORY — DX: Type 2 diabetes mellitus without complications: E11.9

## 2023-04-28 LAB — CBC AND DIFFERENTIAL
Absolute NRBC: 0 10*3/uL (ref 0.00–0.00)
Basophils Absolute Automated: 0.02 10*3/uL (ref 0.00–0.08)
Basophils Automated: 0.4 %
Eosinophils Absolute Automated: 0.03 10*3/uL (ref 0.00–0.44)
Eosinophils Automated: 0.6 %
Hematocrit: 37.5 % — ABNORMAL LOW (ref 37.6–49.6)
Hgb: 12.8 g/dL (ref 12.5–17.1)
Immature Granulocytes Absolute: 0.02 10*3/uL (ref 0.00–0.07)
Immature Granulocytes: 0.4 %
Instrument Absolute Neutrophil Count: 3.77 10*3/uL (ref 1.10–6.33)
Lymphocytes Absolute Automated: 0.87 10*3/uL (ref 0.42–3.22)
Lymphocytes Automated: 16.3 %
MCH: 30.3 pg (ref 25.1–33.5)
MCHC: 34.1 g/dL (ref 31.5–35.8)
MCV: 88.9 fL (ref 78.0–96.0)
MPV: 12.5 fL (ref 8.9–12.5)
Monocytes Absolute Automated: 0.63 10*3/uL (ref 0.21–0.85)
Monocytes: 11.8 %
Neutrophils Absolute: 3.77 10*3/uL (ref 1.10–6.33)
Neutrophils: 70.5 %
Nucleated RBC: 0 /100 WBC (ref 0.0–0.0)
Platelets: 131 10*3/uL — ABNORMAL LOW (ref 142–346)
RBC: 4.22 10*6/uL (ref 4.20–5.90)
RDW: 13 % (ref 11–15)
WBC: 5.34 10*3/uL (ref 3.10–9.50)

## 2023-04-28 LAB — COMPREHENSIVE METABOLIC PANEL
ALT: 50 U/L (ref 0–55)
AST (SGOT): 41 U/L (ref 5–41)
Albumin/Globulin Ratio: 0.8 — ABNORMAL LOW (ref 0.9–2.2)
Albumin: 3.6 g/dL (ref 3.5–5.0)
Alkaline Phosphatase: 137 U/L — ABNORMAL HIGH (ref 37–117)
Anion Gap: 12 (ref 5.0–15.0)
BUN: 12 mg/dL (ref 9.0–28.0)
Bilirubin, Total: 0.9 mg/dL (ref 0.2–1.2)
CO2: 25 mEq/L (ref 17–29)
Calcium: 8.8 mg/dL (ref 8.5–10.5)
Chloride: 102 mEq/L (ref 99–111)
Creatinine: 0.8 mg/dL (ref 0.5–1.5)
Globulin: 4.3 g/dL — ABNORMAL HIGH (ref 2.0–3.6)
Glucose: 232 mg/dL — ABNORMAL HIGH (ref 70–100)
Potassium: 3.4 mEq/L — ABNORMAL LOW (ref 3.5–5.3)
Protein, Total: 7.9 g/dL (ref 6.0–8.3)
Sodium: 139 mEq/L (ref 135–145)
eGFR: >60 mL/min/{1.73_m2} (ref >=60)

## 2023-04-28 LAB — ECG 12-LEAD
Atrial Rate: 72 {beats}/min
IHS MUSE NARRATIVE AND IMPRESSION: NORMAL
P Axis: 35 degrees
P-R Interval: 182 ms
Q-T Interval: 384 ms
QRS Duration: 102 ms
QTC Calculation (Bezet): 420 ms
R Axis: 31 degrees
T Axis: 23 degrees
Ventricular Rate: 72 {beats}/min

## 2023-04-28 LAB — URINALYSIS WITH REFLEX TO MICROSCOPIC EXAM - REFLEX TO CULTURE
Bilirubin, UA: NEGATIVE
Blood, UA: NEGATIVE
Ketones UA: NEGATIVE
Leukocyte Esterase, UA: NEGATIVE
Nitrite, UA: NEGATIVE
Protein, UR: NEGATIVE
Specific Gravity UA: 1.021 (ref 1.001–1.035)
Urine pH: 6.5 (ref 5.0–8.0)
Urobilinogen, UA: 2 mg/dL — AB (ref 0.2–2.0)

## 2023-04-28 LAB — HIGH SENSITIVITY TROPONIN-I WITH DELTA
hs Troponin-I Delta: UNDETERMINED ng/L
hs Troponin-I: <2.7 ng/L

## 2023-04-28 LAB — HIGH SENSITIVITY TROPONIN-I: hs Troponin-I: <2.7 ng/L

## 2023-04-28 LAB — LIPASE: Lipase: 26 U/L (ref 8–78)

## 2023-04-28 MED ORDER — MORPHINE SULFATE 4 MG/ML IJ/IV SOLN (WRAP)
4.0000 mg | Freq: Once | Status: AC
Start: 2023-04-28 — End: 2023-04-28
  Administered 2023-04-28: 4 mg via INTRAVENOUS
  Filled 2023-04-28: qty 1

## 2023-04-28 MED ORDER — ONDANSETRON HCL 4 MG/2ML IJ SOLN
4.0000 mg | Freq: Once | INTRAMUSCULAR | Status: AC
Start: 2023-04-28 — End: 2023-04-28
  Administered 2023-04-28: 4 mg via INTRAVENOUS
  Filled 2023-04-28: qty 2

## 2023-04-28 MED ORDER — KETOROLAC TROMETHAMINE 30 MG/ML IJ SOLN
30.0000 mg | Freq: Once | INTRAMUSCULAR | Status: AC
Start: 2023-04-28 — End: 2023-04-28
  Administered 2023-04-28: 30 mg via INTRAVENOUS
  Filled 2023-04-28: qty 1

## 2023-04-28 MED ORDER — SODIUM CHLORIDE 0.9 % IV BOLUS
1000.0000 mL | Freq: Once | INTRAVENOUS | Status: AC
Start: 2023-04-28 — End: 2023-04-28
  Administered 2023-04-28: 1000 mL via INTRAVENOUS

## 2023-04-28 MED ORDER — NAPROXEN SODIUM 550 MG PO TABS
550.0000 mg | ORAL_TABLET | Freq: Two times a day (BID) | ORAL | 0 refills | Status: DC
Start: 2023-04-28 — End: 2024-09-06

## 2023-04-28 MED ORDER — IOHEXOL 350 MG/ML IV SOLN
100.0000 mL | Freq: Once | INTRAVENOUS | Status: AC | PRN
Start: 2023-04-28 — End: 2023-04-28
  Administered 2023-04-28: 100 mL via INTRAVENOUS

## 2023-04-28 NOTE — Discharge Instructions (Addendum)
F/u with your gastroenterologist in 2-3 days for re-eval. return to ed if worsen or not better or have any acute concerns.  Asked your gastroenterologist to order a HIDA scan to further evaluate for acute cholecystitis as well as biliary dyskinesia (abnormal function of the gallbladder).  Try to abstain from eating fatty foods until after being cleared by your GI doctor.

## 2023-04-28 NOTE — ED Provider Notes (Signed)
History   No chief complaint on file.    57 year old male with history of diabetes and high cholesterol presents with complaints of epigastric cramping/burning abdominal pain radiating to the back for the past 2 days intermittently.  Tonight pain started about an hour ago and associated with diaphoresis.  Denies f/c/n/v/d/c/chest pain/sob/headahce/photophobia/rash/focal neuro deficits/loc/neck pain/cough/runny nose congestion    The history is provided by the patient, medical records and the spouse. No language interpreter was used.        No past medical history on file.    No past surgical history on file.    No family history on file.    Social       .     Allergies   Allergen Reactions    Codeine     Fluarix [Influenza Virus Vaccine]        Home Medications               dilTIAZem (CARDIZEM LA) 360 MG 24 hr tablet          fexofenadine (ALLEGRA) 180 MG tablet          hydroCHLOROthiazide (HYDRODIURIL) 25 MG tablet     Take 25 mg by mouth every morning     metFORMIN (GLUCOPHAGE-XR) 500 MG 24 hr tablet     Take 1,000 mg by mouth daily     olmesartan-hydrochlorothiazide (BENICAR HCT) 40-12.5 MG per tablet     Take 1 tablet by mouth daily     ursodiol (ACTIGALL) 300 MG capsule                  Review of Systems   Constitutional:  Negative for chills and fever.   HENT:  Negative for rhinorrhea and sore throat.    Eyes:  Negative for photophobia and discharge.   Respiratory:  Negative for cough and shortness of breath.    Cardiovascular:  Negative for chest pain and palpitations.   Gastrointestinal:  Positive for abdominal pain. Negative for diarrhea, nausea and vomiting.   Genitourinary:  Negative for dysuria, frequency and hematuria.   Musculoskeletal:  Negative for back pain, myalgias and neck pain.   Neurological:  Negative for dizziness, syncope, weakness, light-headedness, numbness and headaches.   Psychiatric/Behavioral:  Negative for confusion and suicidal ideas.        Physical Exam    BP: 158/86, Heart  Rate: 79, Temp: 97.8 F (36.6 C), SpO2: 98 %, Weight: 87.1 kg    Physical Exam  Vitals and nursing note reviewed.   Constitutional:       General: He is not in acute distress.     Appearance: He is well-developed. He is ill-appearing. He is not toxic-appearing or diaphoretic.   HENT:      Head: Normocephalic and atraumatic.      Nose: Nose normal.      Mouth/Throat:      Pharynx: No oropharyngeal exudate.   Eyes:      General: Lids are normal.         Right eye: No discharge.         Left eye: No discharge.      Extraocular Movements: Extraocular movements intact.      Conjunctiva/sclera: Conjunctivae normal.      Right eye: Right conjunctiva is not injected. No exudate.     Left eye: Left conjunctiva is not injected. No exudate.     Pupils: Pupils are equal, round, and reactive to light.   Neck:  Vascular: No carotid bruit or JVD.      Trachea: No tracheal tenderness.   Cardiovascular:      Rate and Rhythm: Normal rate and regular rhythm.      Pulses: Normal pulses.      Heart sounds: Normal heart sounds.   Pulmonary:      Effort: Pulmonary effort is normal. No respiratory distress.      Breath sounds: Normal breath sounds. No stridor. No decreased breath sounds, wheezing, rhonchi or rales.   Chest:      Chest wall: No tenderness.   Abdominal:      General: Bowel sounds are normal. There is no distension.      Palpations: Abdomen is soft. There is no mass.      Tenderness: There is abdominal tenderness. There is no right CVA tenderness, left CVA tenderness, guarding or rebound.      Hernia: No hernia is present.       Musculoskeletal:         General: No tenderness. Normal range of motion.      Cervical back: Full passive range of motion without pain, normal range of motion and neck supple. No spinous process tenderness or muscular tenderness. Normal range of motion.      Comments: No calf tenderness, no Homman's sign   Skin:     General: Skin is warm.      Coloration: Skin is not pale.      Findings: No rash.    Neurological:      General: No focal deficit present.      Mental Status: He is alert and oriented to person, place, and time.      GCS: GCS eye subscore is 4. GCS verbal subscore is 5. GCS motor subscore is 6.      Cranial Nerves: No cranial nerve deficit.      Sensory: Sensation is intact. No sensory deficit.      Motor: Motor function is intact. No weakness.      Coordination: Coordination is intact. Coordination normal.      Deep Tendon Reflexes: Reflexes are normal and symmetric.      Reflex Scores:       Patellar reflexes are 2+ on the right side and 2+ on the left side.  Psychiatric:         Speech: Speech normal.         Behavior: Behavior normal.         Thought Content: Thought content normal.         Judgment: Judgment normal.           MDM and ED Course     ED Medication Orders (From admission, onward)      None               Medical Decision Making  I, Janese Banks, have assumed care of patient.  I have completed his/her evaluation, reviewed all pertinent data, and determined his final disposition.    " *This note was generated by the Epic EMR system/ Dragon speech recognition and may contain inherent errors or omissions not intended by the user. Grammatical errors, random word insertions, deletions, pronoun errors and incomplete sentences are occasional consequences of this technology due to software limitations. Not all errors are caught or corrected. If there are questions or concerns about the content of this note or information contained within the body of this dictation they should be addressed directly with the author for clarification."  Oxygen saturation by pulse oximetry is 95%-100%, Normal.  Interventions: None Needed.     I reviewed all labs and/or radiology studies.     I reviewed nursing recorded vitals and history including PMSFHX         Number and Complexity of Problems Addressed (select at least one)    Complexity: High: 1 acute or chronic illness or injury that poses a threat  to life or bodily function      Presenting acute/chronic problems: Abdominal pain, diaphoresis    Differential diagnosis to include but not limited to: Pancreatitis, cholecystitis, biliary colic, choledocholithiasis, gastritis, ACS    workup:  Labs, meds, ultrasound, IV fluids, EKG, reeval  Chronic illness impacting care (obesity, diabetes, hypertension, elderly - state impact): 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  ______________________________________________________________________  Amount/complexity of Data Reviewed   {Tip - Level 4 = (1/3 of the following categories); Level 5 = (2/3 categories).    This message will disappear upon signing. :  L\  Look below for information        Independent interpretation of  EKG:   EKG Interpretation  EKG interpreted by ED physician  Rate: Normal for age.  Rhythm: Normal sinus rhythm  Axis: Normal for age  PR, QRS and QT intervals:  normal for age and rate  ST Segments: No deviations suggestive of ischemia  Impression: Normal ECG with no evidence of ischemia.    Attending : Dr. Reine Just    Independent visualized and interpretation of radiological study by me:     CAT scan of the abdomen pelvis does not show any signs of bowel obstruction-read by Dr. Jannet Askew and used for medical decision making.  Rest of the exam will be read by the radiologist    ______________________________________________________________________      Discussion of managment:    WBC 5.34, hemoglobin 12.8 and platelets 131.  No left shift.  Urinalysis without infection.  Potassium 3.4.  Alk phos 137.  Other LFTs within normal limits.  Anion gap of 12 and a glucose of 220.  Lipase within normal limits.  Troponin x 2 less than 2.7.    US Abdomen Limited RUQ (Final result)  Result time 04/28/23 05:28:03  Final result by Campbell Stall, MD (04/28/23 05:28:03)             Impression:          Mildly distended gallbladder with wall thickening and layering sludge.  Sonographic findings  support a diagnosis of acute cholecystitis in the  appropriate clinical setting. However, this sonographic appearance can also  be associated with liver failure. If clinically equivocal, consider HIDA  scan or MRI.    Campbell Stall  04/28/2023 5:28 AM        Narrative:     HISTORY: Abdominal pain    COMPARISON: 07/01/2022    FINDINGS:    Pancreas: Visualized portion is unremarkable.  Aorta/IVC: Visualized aorta is nondilated.  Liver: Coarsened and echogenic liver with nodular contours. No suspicious  mass seen.  Gallbladder: Distended gallbladder with wall thickening. Layering sludge.  Evaluation for sonographic Murphy's sign is not reliable due to pain  medication.  Bile ducts: Nondilated. Common bile duct measures 0.5 cm.  Right kidney measures 12.1 cm.  No hydronephrosis.    Due to the findings of the ultrasound we will get a CAT scan to further evaluate.  Patient's abdominal pain decreased to 1/10 after Toradol.    CT Abd/Pelvis with IV Contrast only (Final result)  Result time 04/28/23 05:58:10  Final result by Campbell Stall, MD (04/28/23 05:58:10)             Impression:         1.The gallbladder is mildly distended with mild adjacent fat stranding.  Findings support a diagnosis of acute cholecystitis in the appropriate  clinical setting. However, these findings may also be seen with cirrhosis.  If clinically equivocal, consider HIDA scan or MRI.  2.No biliary obstruction. No other acute abnormalities.    Campbell Stall  04/28/2023 5:58 AM        Narrative:     HISTORY: Abdominal pain    COMPARISON: 07/01/2022    TECHNIQUE: CT of the abdomen and pelvis performed with intravenous  contrast. The following dose reduction techniques were utilized: automated  exposure control and/or adjustment of the mA and/or KV according to patient  size, and the use of an iterative reconstruction technique.    CONTRAST: iohexol (OMNIPAQUE) 350 MG/ML injection 100 mL    FINDINGS:    Lower chest: Linear  scarring/atelectasis.    Cardiomegaly.    Prominent anterior cardiophrenic lymph nodes, for instance 1 cm node on  3:24.    Hepatobiliary: Nodular liver contours. Patent portal veins.    Mildly distended gallbladder with surrounding fat stranding. No abnormal  biliary distension.    Pancreas: Unremarkable. No abnormal pancreatic ductal dilation.    Spleen: Splenomegaly measuring 14 cm.    Adrenals: Unremarkable.    Kidneys and ureters: No hydronephrosis. No suspicious mass.    Bladder: Unremarkable.    Reproductive organs: Unremarkable.    Gastrointestinal: No bowel obstruction. No abnormal bowel wall thickening.  Normal appendix.    Sigmoid diverticulosis.    Peritoneum/retroperitoneum: No abnormal free fluid. No loculated fluid  collection. No free intraperitoneal air.    Lymph nodes: Prominent porta hepatis and aortocaval lymph nodes,  nonspecific, could be due to volume overload.    Vessels: The abdominal aorta is non-dilated.    Musculoskeletal: No acute or aggressive osseous abnormality.    Soft tissues: Unremarkable.    The findings of the CAT scan are likely related to patient's history of primary biliary cholangitis.  Doubt patient having cholecystitis in the face of absence of fever and normal white count without a left shift.      Discussed study results and treatment plan with patient and family.       Will d/c home with meds and physician f/u. Pt. Understands to return to ed if worsen or not better or have any acute concerns.      Risk of Complications and/or Morbidity or Mortality of Patient Management  (select one if applicable)    Risk: High Risk and Parenteral controlled substances          Amount and/or Complexity of Data Reviewed  Labs: ordered.  Radiology: ordered.    Risk  Prescription drug management.                     Procedures    Clinical Impression & Disposition     Clinical Impression  Final diagnoses:   Epigastric abdominal pain        ED Disposition       ED Disposition   Discharge     Condition   --    Date/Time   Mon Apr 28, 2023  6:21 AM    Comment   Milinda Antis discharge to home/self care.    Condition at disposition: Stable  New Prescriptions    NAPROXEN SODIUM (ANAPROX) 550 MG TABLET    Take 1 tablet (550 mg) by mouth 2 (two) times daily with meals                   Janese Banks, MD  04/28/23 [phone]

## 2023-12-10 DIAGNOSIS — Z860101 Personal history of adenomatous and serrated colon polyps: Secondary | ICD-10-CM

## 2023-12-10 DIAGNOSIS — K7460 Unspecified cirrhosis of liver: Secondary | ICD-10-CM

## 2023-12-10 DIAGNOSIS — K743 Primary biliary cirrhosis: Secondary | ICD-10-CM

## 2023-12-10 HISTORY — DX: Personal history of adenomatous and serrated colon polyps: Z86.0101

## 2023-12-10 HISTORY — DX: Unspecified cirrhosis of liver: K74.60

## 2023-12-10 HISTORY — DX: Primary biliary cirrhosis: K74.3

## 2024-07-23 ENCOUNTER — Other Ambulatory Visit: Payer: Self-pay | Admitting: Gastroenterology

## 2024-07-23 DIAGNOSIS — K759 Inflammatory liver disease, unspecified: Secondary | ICD-10-CM

## 2024-07-27 ENCOUNTER — Ambulatory Visit
Admission: RE | Admit: 2024-07-27 | Discharge: 2024-07-27 | Discharge status: Home / Self Care | Source: Ambulatory Visit | Attending: Gastroenterology

## 2024-07-27 ENCOUNTER — Other Ambulatory Visit: Payer: Self-pay | Admitting: Gastroenterology

## 2024-07-27 DIAGNOSIS — K759 Inflammatory liver disease, unspecified: Secondary | ICD-10-CM | POA: Insufficient documentation

## 2024-07-27 MED ORDER — GADOBUTROL 1 MMOL/ML IV SOSY (WRAP)
10.0000 mL | Freq: Once | INTRAVENOUS | Status: AC | PRN
Start: 2024-07-27 — End: 2024-07-27
  Administered 2024-07-27: 10 mL via INTRAVENOUS
  Filled 2024-07-27: qty 10

## 2024-09-06 ENCOUNTER — Ambulatory Visit

## 2024-09-06 NOTE — PSS Phone Screening (Signed)
 Pre-Anesthesia Evaluation 09/16/24    Pre-op phone visit requested by:   Reason for pre-op phone visit: Patient anticipating COLONOSCOPY, SCREENING, ESOPHAGOGASTRODUODENOSCOPY (EGD), BIOPSY procedure.         No orders of the defined types were placed in this encounter.      History of Present Illness/Summary:      Problem List:  Medical Problems       Hospital Problem List  Date Reviewed: 03/15/2021   None        Non-Hospital Problem List  Date Reviewed: 03/15/2021   None       Medical History   Diagnosis Date    Cholangitic cirrhosis (CMS/HCC) 2025    Preop dx    Heart murmur     2022 echo on chart    Hepatic cirrhosis, unspecified hepatic cirrhosis type, unspecified whether ascites present (CMS/HCC) 2025    Preop dx, Primary biliary cholangitis    History of adenomatous polyp of colon 2025    Preop dx    Hyperlipidemia     unable to tolerate statins    Hypertension     controlled w/ meds    Primary biliary cholangitis (CMS/HCC)     follows hepatologist    Type 2 diabetes mellitus (CMS/HCC)     pcp monitoring, fbs running low 100s, last a1c 07/2024, pt has changed diet     Past Surgical History[1]     Medication List            Accurate as of September 06, 2024  9:16 AM. Always use your most recent med list.                amLODIPine 10 MG tablet  Take 1 tablet (10 mg) by mouth daily  Commonly known as: NORVASC  Medication Adjustments for Surgery: Take as prescribed     b complex vitamins capsule  Take 1 capsule by mouth once daily  Medication Adjustments for Surgery: Hold day of surgery     D3 25 MCG (1000 UT) capsule  Take 1,000 Units by mouth once daily  Generic drug: Cholecalciferol  Medication Adjustments for Surgery: Hold day of surgery     fexofenadine 180 MG tablet  Commonly known as: ALLEGRA  Medication Adjustments for Surgery: Take as prescribed     Livdelzi 10 MG Caps  Take 10 mg by mouth once daily  Generic drug: Seladelpar Lysine  Medication Adjustments for Surgery: Take as prescribed     MAGNESIUM CITRATE  PO  Take by mouth once daily  Medication Adjustments for Surgery: Hold day of surgery     metFORMIN 500 MG 24 hr tablet  Take 2 tablets (1,000 mg) by mouth daily  Commonly known as: GLUCOPHAGE-XR  Medication Adjustments for Surgery: Take as prescribed     nebivolol 10 MG tablet  Take 1 tablet (10 mg) by mouth daily  Commonly known as: BYSTOLIC  Medication Adjustments for Surgery: Take as prescribed     olmesartan-hydrochlorothiazide 40-12.5 MG per tablet  Take 1 tablet by mouth daily  Commonly known as: BENICAR HCT  Medication Adjustments for Surgery: Take as prescribed     ursodiol 300 MG capsule  Commonly known as: ACTIGALL  Medication Adjustments for Surgery: Take as prescribed            Allergies[2]  Family History[3]  Social History     Occupational History    Not on file   Tobacco Use    Smoking status: Never    Smokeless  tobacco: Never   Vaping Use    Vaping status: Never Used   Substance and Sexual Activity    Alcohol use: Not Currently     Comment: rare, never been heavy drinker    Drug use: Never    Sexual activity: Not on file               Exam Scores:   SDB score  OSA Risk Category: No Risk        STBUR score       PONV score  Nausea Risk: LOW RISK    MST score  MST Score: 0    PEN-FAST score       Frailty score       CHADsVasc            Visit Vitals  Ht 1.753 m (5' 9)   Wt 82.6 kg (182 lb)   BMI 26.88 kg/m   Overweight based on BMI.                                       [1]   Past Surgical History:  Procedure Laterality Date    COLONOSCOPY W/ BIOPSIES  2020    multiple    LIVER BIOPSY  2023    SHOULDER SURGERY Left 2013    torn labrum   [2]   Allergies  Allergen Reactions    Codeine Hives    Pneumococcal Vaccine Hives and Edema     Swelling at site and rash    Statins Joint Pain   [3] No family history on file.

## 2024-09-16 ENCOUNTER — Ambulatory Visit: Admitting: Certified Registered"

## 2024-09-16 ENCOUNTER — Encounter: Payer: Self-pay | Admitting: Gastroenterology

## 2024-09-16 ENCOUNTER — Encounter
Admission: RE | Disposition: A | Discharge status: Home / Self Care | Payer: Self-pay | Source: Ambulatory Visit | Attending: Gastroenterology

## 2024-09-16 ENCOUNTER — Ambulatory Visit
Admission: RE | Admit: 2024-09-16 | Discharge: 2024-09-16 | Disposition: A | Discharge status: Home / Self Care | Source: Ambulatory Visit | Attending: Gastroenterology | Admitting: Gastroenterology

## 2024-09-16 DIAGNOSIS — K648 Other hemorrhoids: Secondary | ICD-10-CM | POA: Insufficient documentation

## 2024-09-16 DIAGNOSIS — K743 Primary biliary cirrhosis: Secondary | ICD-10-CM

## 2024-09-16 DIAGNOSIS — Z860101 Personal history of adenomatous and serrated colon polyps: Secondary | ICD-10-CM

## 2024-09-16 DIAGNOSIS — Z1211 Encounter for screening for malignant neoplasm of colon: Secondary | ICD-10-CM | POA: Insufficient documentation

## 2024-09-16 DIAGNOSIS — K7460 Unspecified cirrhosis of liver: Secondary | ICD-10-CM | POA: Insufficient documentation

## 2024-09-16 DIAGNOSIS — K2980 Duodenitis without bleeding: Secondary | ICD-10-CM | POA: Insufficient documentation

## 2024-09-16 DIAGNOSIS — K2970 Gastritis, unspecified, without bleeding: Secondary | ICD-10-CM | POA: Insufficient documentation

## 2024-09-16 DIAGNOSIS — K449 Diaphragmatic hernia without obstruction or gangrene: Secondary | ICD-10-CM | POA: Insufficient documentation

## 2024-09-16 DIAGNOSIS — Z860100 Personal history of colon polyps, unspecified: Secondary | ICD-10-CM | POA: Insufficient documentation

## 2024-09-16 DIAGNOSIS — K5730 Diverticulosis of large intestine without perforation or abscess without bleeding: Secondary | ICD-10-CM | POA: Insufficient documentation

## 2024-09-16 DIAGNOSIS — Z800 Family history of malignant neoplasm of digestive organs: Secondary | ICD-10-CM | POA: Insufficient documentation

## 2024-09-16 DIAGNOSIS — D124 Benign neoplasm of descending colon: Secondary | ICD-10-CM | POA: Insufficient documentation

## 2024-09-16 DIAGNOSIS — I8510 Secondary esophageal varices without bleeding: Secondary | ICD-10-CM | POA: Insufficient documentation

## 2024-09-16 HISTORY — PX: COLONOSCOPY, REMOVAL OF TUMOR, POLYP, OR OTHER LESION BY SNARE TECHNIQUE: SHX3473

## 2024-09-16 HISTORY — PX: EGD, BANDING: SHX3794

## 2024-09-16 LAB — WHOLE BLOOD GLUCOSE POCT: Whole Blood Glucose POCT: 118 mg/dL — ABNORMAL HIGH (ref 70–100)

## 2024-09-16 SURGERY — ESOPHAGOGASTRODUODENOSCOPY (EGD), BANDING VARICES
Anesthesia: Anesthesia MAC / Sedation | Site: Esophagus | Wound class: Clean Contaminated

## 2024-09-16 MED ORDER — PROPOFOL 10 MG/ML IV EMUL (WRAP)
INTRAVENOUS | Status: AC
Start: 2024-09-16 — End: 2024-09-16
  Filled 2024-09-16: qty 60

## 2024-09-16 MED ORDER — LIDOCAINE HCL (PF) 2 % IJ SOLN
INTRAMUSCULAR | Status: DC | PRN
Start: 2024-09-16 — End: 2024-09-16
  Administered 2024-09-16: 200 mg via INTRAVENOUS

## 2024-09-16 MED ORDER — PROPOFOL 10 MG/ML IV EMUL (WRAP)
INTRAVENOUS | Status: DC | PRN
Start: 2024-09-16 — End: 2024-09-16
  Administered 2024-09-16 (×2): 100 mg via INTRAVENOUS

## 2024-09-16 MED ORDER — PROPOFOL INFUSION 10 MG/ML
INTRAVENOUS | Status: DC | PRN
Start: 2024-09-16 — End: 2024-09-16
  Administered 2024-09-16: 100 ug/kg/min via INTRAVENOUS
  Administered 2024-09-16: 200 ug/kg/min via INTRAVENOUS

## 2024-09-16 MED ORDER — LIDOCAINE HCL (PF) 2 % IJ SOLN
INTRAMUSCULAR | Status: AC
Start: 2024-09-16 — End: 2024-09-16
  Filled 2024-09-16: qty 5

## 2024-09-16 MED ORDER — LACTATED RINGERS IV SOLN
INTRAVENOUS | Status: DC
Start: 2024-09-16 — End: 2024-09-16
  Administered 2024-09-16: 1000 mL via INTRAVENOUS
  Filled 2024-09-16: qty 1000

## 2024-09-16 SURGICAL SUPPLY — 28 items
ADAPTER ENDOSCOPE CLEANING AIR WATER PORPOISE OLYMPUS 160 180 190 (Adapter) ×4 IMPLANT
BASIN EMESIS 700 ML GRADUATE GRAPHITE MAUVE PLASTIC MEDLINE ORAL (Patient Supply) ×4 IMPLANT
BLOCK BITE OD20 MM POLYETHYLENE ADULT MOUTHPIECE STRAP RETENTION RIM (Patient Supply) ×4 IMPLANT
BRUSH ENDOSCOPIC CLEANING SLIM 2 BUNDLE CATHETER ROBUST VALVE OD5 MM (Endoscopic Supplies) ×4 IMPLANT
CATHETER OD6 FR ODSEC12-13.5-15 MM L180 CM CRE BALLOON DILATATION L8 (Balloons) IMPLANT
CATHETER OD6 FR ODSEC12-13.5-15 MM L180 CM CREâ„¢ BALLOON DILATATION L8 (Balloons) IMPLANT
CATHETER OD7 FR L300 CM BIPOLAR ROUND DISTAL TIP STANDARD CONNECTOR (Procedure Accessories) IMPLANT
CATHETER OD7 FR ODSEC25 GA ID.24 MM L210 CM BIPOLAR ROUND DISTAL TIP (Catheter Miscellaneous) IMPLANT
CONTAINER SPECIMEN 90 ML CYLINDRICAL CLICK LOCK 10% NBF PREFILL (Procedure Accessories) ×10 IMPLANT
ELECTRODE ADULT PATIENT RETURN L9 FT REM POLYHESIVE ACRYLIC FOAM (Procedure Accessories) ×2 IMPLANT
FORCEPS BIOPSY L240 CM +2.8 MM HOT OD2.2 MM RADIAL JAW (Endoscopic Supplies) IMPLANT
FORCEPS BIOPSY L240 CM MICROMESH TEETH STREAMLINE CATHETER NEEDLE (Instrument) ×4 IMPLANT
GOWN ISOLATION XL LEVEL 3 FULL BACK HOOK LOOP NECK KNIT CUFF SMS BLUE (Patient Supply) ×8 IMPLANT
KIT ENDOSCOPIC COMPLIANCE ENDOKIT ORCAPOD 4 1.1 OZ (Kits) ×4 IMPLANT
LIFTER SURGICAL SUBMUCOSAL LIFT AGENT EVERLIFT 5 ML (Endoscopic Supplies) ×4 IMPLANT
LIGATOR L2.8 MM OD8.6-11.5 MM 7 ESOPHAGEAL 1 STRING MULTIPLE BAND (Band) ×2 IMPLANT
MANIFOLD SUCTION 2 STANDARD 4 PORT NEPTUNE 2 WASTE MANAGEMENT SYSTEM (Filter) ×4 IMPLANT
MARKER ENDOSCOPIC PERMANENT INDICATION DARK SYRINGE SPOT EX 5 ML (Syringes, Needles) IMPLANT
PROBE COAGULATION L7.2 FT CIRCUMFERENTIAL PLUG PLAY FUNCTIONALITY (Endoscopic Supplies) IMPLANT
PROBE ELECTROSURGICAL L220 CM FLEXIBLE STRAIGHT FIRE OD2.3 MM FIAPC (Procedure Accessories) IMPLANT
SNARE 2.8 CM ROUND L240 CM OD10 MM ODSEC2.4 MM CAPTIVATOR LOOP STIFF (Endoscopic Supplies) ×2 IMPLANT
SNARE MD OVAL 240CM 2.4 MM CPTFLX LP FLXBL ENDOSCOPIC POLYPECTOMY 27MM (Endoscopic Supplies) IMPLANT
SPONGE ENDOSCOPIC 2 ENZYMATIC DETERGENT 2 CELL ABSORBENT DRY IMBIB (Procedure Accessories) ×4 IMPLANT
SPONGE GAUZE L4 IN X W4 IN 4 PLY NONWOVEN LINT FREE CURITY RAYON (Sponge) ×4 IMPLANT
SYRINGE 50 ML GRADUATE NONPYROGENIC DEHP FREE PVC FREE BD MEDICAL (Syringes, Needles) ×4 IMPLANT
TRAP INLINE SUCTION CHAMBER FLAT SURFACE QUICK CATCH SPECIMEN JAR (Endoscopic Supplies) IMPLANT
TRAP SPECIMEN REMOVAL L15 CM MAGNIFY WINDOW MEASUREMENT GUIDE ETRAP (Procedure Accessories) ×2 IMPLANT
TUBING SUCTION ID3/16 IN L10 FT NONCONDUCTIVE STRAIGHT MALE FEMALE (Tubing) ×4 IMPLANT

## 2024-09-16 NOTE — Anesthesia Postprocedure Evaluation (Signed)
 Anesthesia Post Evaluation    Patient: Stephen Lutz    ESOPHAGOGASTRODUODENOSCOPY (EGD), BANDING VARICES (Esophagus)  COLONOSCOPY, REMOVAL OF TUMOR, POLYP, OR OTHER LESION BY SNARE TECHNIQUE (Anus)    Anesthesia type: MAC    Last Vitals:   Vitals Value Taken Time   BP 127/65 09/16/24 12:40   Temp 36.6 C (97.8 F) 09/16/24 12:29   Pulse 62 09/16/24 12:50   Resp 12 09/16/24 12:50   SpO2 96 % 09/16/24 12:40                 Anesthesia Post Evaluation:     Patient Evaluated: PACU  Patient Participation: complete - patient participated  Level of Consciousness: awake and alert  Pain Score: 0  Pain Management: adequate  Multimodal analgesia pain management approach  Strategies: other (use comment) (nO PAIN, TIVA anesthesia)  Airway Patency: patent  Two or more mitigation strategies used for obstructive sleep apnea.  Strategies: intraop administration of CPAP/nasophargyneal airway/oral appliance and preop initiation of NIPPV    Anesthetic complications: No      PONV Status: none    Cardiovascular status: acceptable  Respiratory status: acceptable  Hydration status: acceptable          Signed by: Verneita JULIANNA Gentile, MD, 09/16/2024 12:50 PM

## 2024-09-16 NOTE — Anesthesia Preprocedure Evaluation (Signed)
 Anesthesia Evaluation    AIRWAY    Mallampati: III    TM distance: >3 FB  Neck ROM: full  Mouth Opening:full   CARDIOVASCULAR    cardiovascular exam normal       DENTAL                   PULMONARY    pulmonary exam normal and clear to auscultation     OTHER FINDINGS                                        Relevant Problems   No relevant active problems               Anesthesia Plan    ASA 3     MAC                     intravenous induction   Detailed anesthesia plan: general IV and MAC        Post op pain management: per surgeon    informed consent obtained    ECG reviewed (04/28/23: NSR)  pertinent labs reviewed               Signed by: Verneita JULIANNA Gentile, MD 09/16/24 11:28 AM

## 2024-09-16 NOTE — Discharge Instr - AVS First Page (Addendum)
 Endoscopy Discharge Instructions    General Instructions:  1. Following sedation, your judgement, perception, and coordination are considered impaired. Even though you may feel awake and alert, you are considered legally intoxicated. Therefore, until the next morning;  Do not Drive  Do not operate appliances or equipment that requires reaction time (e.g. Stove, electrical tools, machinery)  Do not sign legal documents or be involved in important decisions.  Do not smoke if alone  Do not drink alcoholic beverages  Go directly home and rest for several hours before resuming your routine activities.  It is highly recommended to have a responsible adult stay with you for the next 24 hours    2. Tenderness, swelling or pain may occur at the IV site where you received sedation. If you experience this, apply warm soaks to the area. Notify your physician if this persists.    Instructions Specific To Procedures - Report To Physician Any Of The Following:    Upper Endoscopy, ERCP, Dilations   1. Pain in Chest   2. Nausea/vomitting   3. Fevers/Chills within 24 hours after procedure. Temp>101deg F   4. Severe and persistent abdominal pain and bloating              5. Chest Pain or difficulty breathing     In Addition:   Mild throat soreness may follow this procedure. Warm salt water gargling or lozenges of your choice will most likely relieve your discomfort or cold drinks and popsicles.     Colon/Sigmoidoscopy/Proctoscopy   1. Severe and persistent abdominal pain/bloating which does not subside within 2-3 hours   2. Large amount of rectal bleeding (some mucosal blood streaking may occur, especially if biopsy or polypectomy was done or if hemorrhoids are present.   3. Nausea/vomitting   4. Fevers/Chills within 24 hours after procedure. Temp>101deg F              5. Chest pain or difficulty breathing        In Addition:   If polyp has been removed, DO NOT take aspirin or aspirin containing products (e.g. Anacin, Alka Seltzer,  Bufferin, Etc.) or non-steroidal anti-inflammatory drugs (e.g. Advil, Motrin, etc.) for 7 days unless otherwise advised by doctor. Tylenol or extra Strength Tylenol is permitted.      Additional Discharge Instructions  Your diet after the procedure: begin with clear liquid diet after the procedure.  Advance your diet as tolerated.  May be able to eat a soft diet by tonight.  Resume all medications unless otherwise indicated.  Special Instructions: None  Prescriptions given: None        If you have questions or problems contact your MD immediately. If you need immediate attention, call your MD, 911 and/or go to nearest emergency room.

## 2024-09-16 NOTE — H&P (Signed)
Updated H and P scanned in  No changes in exam or history

## 2024-09-16 NOTE — PACU (Signed)
Patient states readiness to be discharged to home.  Patient states pain and nausea are managed at this time. Patient is tolerating clear liquids and ambulates without difficulty. Patient and family verbalize understanding of written discharge instructions as reviewed with nursing staff, home medications, plan of care for pain/nausea management, and activity at home and awareness of available resources to contact from home as necessary. Plan to discharge to home with all belongings and printed paper instructions in discharge folder.

## 2024-09-20 LAB — SURGICAL PATHOLOGY

## 2025-01-13 ENCOUNTER — Other Ambulatory Visit: Payer: Self-pay

## 2025-01-13 DIAGNOSIS — K7460 Unspecified cirrhosis of liver: Secondary | ICD-10-CM

## 2025-01-13 DIAGNOSIS — K743 Primary biliary cirrhosis: Secondary | ICD-10-CM

## 2025-01-27 ENCOUNTER — Ambulatory Visit
